# Patient Record
Sex: Male | Born: 1985 | Race: White | Hispanic: No | Marital: Married | Smoking: Former smoker
Health system: Southern US, Community
[De-identification: ages and names within clinical notes are randomized; demographics above are authoritative.]

## PROBLEM LIST (undated history)

## (undated) DIAGNOSIS — L237 Allergic contact dermatitis due to plants, except food: Secondary | ICD-10-CM

## (undated) DIAGNOSIS — N201 Calculus of ureter: Secondary | ICD-10-CM

## (undated) DIAGNOSIS — F411 Generalized anxiety disorder: Secondary | ICD-10-CM

## (undated) DIAGNOSIS — M1990 Unspecified osteoarthritis, unspecified site: Secondary | ICD-10-CM

## (undated) DIAGNOSIS — K219 Gastro-esophageal reflux disease without esophagitis: Secondary | ICD-10-CM

## (undated) DIAGNOSIS — T7840XA Allergy, unspecified, initial encounter: Secondary | ICD-10-CM

## (undated) DIAGNOSIS — Z87442 Personal history of urinary calculi: Secondary | ICD-10-CM

## (undated) DIAGNOSIS — R7989 Other specified abnormal findings of blood chemistry: Secondary | ICD-10-CM

## (undated) HISTORY — PX: ESOPHAGOGASTRODUODENOSCOPY: SHX1529

## (undated) HISTORY — DX: Unspecified osteoarthritis, unspecified site: M19.90

## (undated) HISTORY — DX: Allergy, unspecified, initial encounter: T78.40XA

## (undated) HISTORY — DX: Gastro-esophageal reflux disease without esophagitis: K21.9

## (undated) HISTORY — PX: TYMPANOSTOMY TUBE PLACEMENT: SHX32

---
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
## 2004-07-20 ENCOUNTER — Emergency Department (HOSPITAL_COMMUNITY): Admission: EM | Admit: 2004-07-20 | Discharge: 2004-07-20 | Payer: Self-pay | Admitting: Emergency Medicine

## 2011-03-17 ENCOUNTER — Emergency Department (HOSPITAL_COMMUNITY): Payer: No Typology Code available for payment source

## 2011-03-17 ENCOUNTER — Emergency Department (HOSPITAL_COMMUNITY)
Admission: EM | Admit: 2011-03-17 | Discharge: 2011-03-17 | Disposition: A | Discharge status: Home / Self Care | Payer: No Typology Code available for payment source | Attending: Emergency Medicine | Admitting: Emergency Medicine

## 2011-03-17 DIAGNOSIS — S335XXA Sprain of ligaments of lumbar spine, initial encounter: Secondary | ICD-10-CM | POA: Insufficient documentation

## 2011-03-17 DIAGNOSIS — M62838 Other muscle spasm: Secondary | ICD-10-CM | POA: Insufficient documentation

## 2011-03-17 DIAGNOSIS — Y92410 Unspecified street and highway as the place of occurrence of the external cause: Secondary | ICD-10-CM | POA: Insufficient documentation

## 2011-03-17 DIAGNOSIS — T148XXA Other injury of unspecified body region, initial encounter: Secondary | ICD-10-CM | POA: Insufficient documentation

## 2011-08-17 ENCOUNTER — Ambulatory Visit: Payer: BC Managed Care – HMO | Admitting: Family Medicine

## 2011-08-17 VITALS — BP 133/79 | HR 73 | Temp 99.3°F | Resp 16 | Ht 70.5 in | Wt 216.0 lb

## 2011-08-17 DIAGNOSIS — J039 Acute tonsillitis, unspecified: Secondary | ICD-10-CM

## 2011-08-17 MED ORDER — CLINDAMYCIN HCL 150 MG PO CAPS: 150.0000 mg | ORAL_CAPSULE | Freq: Three times a day (TID) | ORAL | Status: AC

## 2011-08-17 NOTE — Patient Instructions (Signed)
Tonsillitis Tonsils are lumps of lymphoid tissues at the back of the throat. Each tonsil has 20 crevices (crypts). Tonsils help fight nose and throat infections and keep infection from spreading to other parts of the body for the first 18 months of life. Tonsillitis is an infection of the throat that causes the tonsils to become red, tender, and swollen. CAUSES Sudden and, if treated, temporary (acute) tonsillitis is usually caused by infection with streptococcal bacteria. Long lasting (chronic) tonsillitis occurs when the crypts of the tonsils become filled with pieces of food and bacteria, which makes it easy for the tonsils to become constantly infected. SYMPTOMS  Symptoms of tonsillitis include:  A sore throat.   White patches on the tonsils.   Fever.   Tiredness.  DIAGNOSIS Tonsillitis can be diagnosed through a physical exam. Diagnosis can be confirmed with the results of lab tests, including a throat culture. TREATMENT  The goals of tonsillitis treatment include the reduction of the severity and duration of symptoms, prevention of associated conditions, and prevention of disease transmission. Tonsillitis caused by bacteria can be treated with antibiotics. Usually, treatment with antibiotics is started before the cause of the tonsillitis is known. However, if it is determined that the cause is not bacterial, antibiotics will not treat the tonsillitis. If attacks of tonsillitis are severe and frequent, your caregiver may recommend surgery to remove the tonsils (tonsillectomy). HOME CARE INSTRUCTIONS   Rest as much as possible and get plenty of sleep.   Drink plenty of fluids. While the throat is very sore, eat soft foods or liquids, such as sherbet, soups, or instant breakfast drinks.   Eat frozen ice pops.   Older children and adults may gargle with a warm or cold liquid to help soothe the throat. Mix 1 teaspoon of salt in 1 cup of water.   Other family members who also develop a  sore throat or fever should have a medical exam or throat culture.   Only take over-the-counter or prescription medicines for pain, discomfort, or fever as directed by your caregiver.  SEEK MEDICAL CARE IF:   Your baby is older than 3 months with a rectal temperature of 100.5 F (38.1 C) or higher for more than 1 day.   Large, tender lumps develop in your neck.   A rash develops.   Green, yellow-brown, or bloody substance is coughed up.   You are unable to swallow liquids or food for 24 hours.   Your child is unable to swallow food or liquids for 12 hours.  SEEK IMMEDIATE MEDICAL CARE IF:   You develop any new symptoms such as vomiting, severe headache, stiff neck, chest pain, or trouble breathing or swallowing.   You have severe throat pain along with drooling or voice changes.   You have severe pain, unrelieved with recommended medications.   You are unable to fully open the mouth.   You develop redness, swelling, or severe pain anywhere in the neck.   You have a fever.   Your baby is older than 3 months with a rectal temperature of 102 F (38.9 C) or higher.   Your baby is 6 months old or younger with a rectal temperature of 100.4 F (38 C) or higher.  MAKE SURE YOU:   Understand these instructions.   Will watch your condition.   Will get help right away if you are not doing well or get worse.  Document Released: 04/08/2005 Document Revised: 03/11/2011 Document Reviewed: 09/04/2010 [facility] Patient Information 2012 [employer] 

## 2011-08-17 NOTE — Progress Notes (Signed)
26 year old gentleman who works for the city [employer] for the last 3 days. It has been worsening he has a low-grade fever fever. He has no ear pain or shortness of breath.  Patient discontinued dipping tobacco about 2 weeks ago and he has concerns about the possibility of throat cancer.  Objective: Patient has bilateral anterior cervical adenopathy and he has swollen tonsils bilaterally. TMs are normal. Patient is in no acute distress. Respirations are normal.  Assessment: Acute tonsillitis.  Plan: Clindamycin 3 times a day. I instructed patient to stay out of work today. I also let him know that he does not have throat cancer.

## 2011-08-21 ENCOUNTER — Telehealth: Payer: Self-pay

## 2011-08-21 NOTE — Telephone Encounter (Signed)
PT SAW DR L ON Monday, COUGHED SOME BLOOD THIS AM AFTER BLOWING NOSE.  CONCERNED AND WOULD LIKE RETURN CALL

## 2011-08-22 NOTE — Telephone Encounter (Signed)
Please call patient and see if he has had any more episodes of bloody nasal drainage.  If so we are happy to resee him, small amounts of nasal bleeding are common in the winter but if he has had ongoing bleeding or pain he should be reseen.

## 2011-08-22 NOTE — Telephone Encounter (Signed)
Spoke with patient states he has not had anymore episodes.  States he's throat is not much better.  Advised patient to return to clinic if no improvement.  Patient states understanding.

## 2011-08-23 ENCOUNTER — Ambulatory Visit (INDEPENDENT_AMBULATORY_CARE_PROVIDER_SITE_OTHER): Payer: BC Managed Care – HMO | Admitting: Family Medicine

## 2011-08-23 VITALS — BP 128/78 | HR 82 | Temp 98.5°F | Resp 16 | Ht 70.5 in | Wt 275.6 lb

## 2011-08-23 DIAGNOSIS — K219 Gastro-esophageal reflux disease without esophagitis: Secondary | ICD-10-CM

## 2011-08-23 DIAGNOSIS — M542 Cervicalgia: Secondary | ICD-10-CM

## 2011-08-23 MED ORDER — ESOMEPRAZOLE MAGNESIUM 40 MG PO CPDR: 40.0000 mg | DELAYED_RELEASE_CAPSULE | Freq: Every day | ORAL | Status: DC

## 2011-08-23 MED ORDER — PREDNISONE 20 MG PO TABS: 40.0000 mg | ORAL_TABLET | Freq: Every day | ORAL | Status: AC

## 2011-08-23 NOTE — Progress Notes (Signed)
26 yo man with persistent neck pain and fullness l>r.  Some acid reflux symptoms.  Some anxiety attacks.  The clindamycin is not working after 6 days.  Heavy Arboriculturist at [employer]  O:  NAD Oroph:  Normal by palpation, inspection Neck:  Supple with FROM, mildly swollen submandib glands, no thyromegaly.  A:  Possible impacted wisdom tooth, +/- reflux  P:  Finish clindamycin Prednisone 20 mg 2 qd Nexium 40 qd x 7 Dental appt on Tues

## 2011-08-23 NOTE — Progress Notes (Signed)
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 He is married and comes in today with his father to. He has recently been treated for tonsillitis with clindamycin but continues to complain of throat soreness and fullness.  He has a history of reflux symptoms and an impacted wisdom tooth on the left. He has a dental appointment due this coming Tuesday.  He's had no dysphagia, fever, cough or shortness of breath.  Objective: No acute distress HEENT normal with the exception of what appears to be impacted wisdom tooth on the left. Palpation around the head and neck reveals no significant adenopathy although he does have some enlargement of the submandibular node on the left. Chest is clear heart is regular without murmur.  Skin is warm and dry and there is no thyromegaly  Assessment: Possible reflux following treatment for tonsillitis. Impacted wisdom tooth on the left.  Plan: Finish clindamycin, Nexium daily x7, prednisone 20 mg 2 tablets daily for 5 and dental appointment on Tuesday

## 2011-09-02 ENCOUNTER — Ambulatory Visit (INDEPENDENT_AMBULATORY_CARE_PROVIDER_SITE_OTHER): Payer: BC Managed Care – HMO | Admitting: Family Medicine

## 2011-09-02 VITALS — BP 131/86 | HR 82 | Temp 99.0°F | Resp 16 | Ht 71.0 in | Wt 276.0 lb

## 2011-09-02 DIAGNOSIS — F411 Generalized anxiety disorder: Secondary | ICD-10-CM

## 2011-09-02 DIAGNOSIS — J00 Acute nasopharyngitis [common cold]: Secondary | ICD-10-CM

## 2011-09-02 DIAGNOSIS — K219 Gastro-esophageal reflux disease without esophagitis: Secondary | ICD-10-CM

## 2011-09-02 DIAGNOSIS — J029 Acute pharyngitis, unspecified: Secondary | ICD-10-CM

## 2011-09-02 MED ORDER — CITALOPRAM HYDROBROMIDE 10 MG PO TABS: 10.0000 mg | ORAL_TABLET | Freq: Every day | ORAL | Status: DC

## 2011-09-02 MED ORDER — SUCRALFATE 1 G PO TABS: 1.0000 g | ORAL_TABLET | Freq: Two times a day (BID) | ORAL | Status: DC

## 2011-09-02 MED ORDER — ALPRAZOLAM 0.25 MG PO TABS: ORAL_TABLET | ORAL | Status: DC

## 2011-09-02 NOTE — Progress Notes (Signed)
  Subjective:    Patient ID: Peter Owens, male    DOB: 30-Dec-1985, 26 y.o.   MRN: [id number]  HPI  Three week history of throat symptoms Rx with prednisone and clindamycin  Burping asscociated with acid reflux.  Placed on Nexium 40mg  1 week ago No history of URI, allergic rhinitis or pnd  Anxiety attacks x 3 weeks 2-3 times a day. Anxiety runs in the family both parents with anxiety Father on prozac Mother takes xanax Patient had been on xanax and paxil in the past   Review of Systems     Objective:   Physical Exam  Constitutional: He appears well-developed and well-nourished.  HENT:  Head: Normocephalic and atraumatic.  Mouth/Throat: Oropharyngeal exudate: erythema posterior pharnyx.  Neck: Neck supple.  Cardiovascular: Normal rate, regular rhythm and normal heart sounds.   Pulmonary/Chest: Effort normal and breath sounds normal.  Skin: Skin is warm.          Assessment & Plan:   1. GERD (gastroesophageal reflux disease)  sucralfate (CARAFATE) 1 G tablet  2. GAD (generalized anxiety disorder)  citalopram (CELEXA) 10 MG tablet, ALPRAZolam (XANAX) 0.25 MG tablet  3. Pharyngitis     Follow up with Primary MD in 2 weeks

## 2011-09-06 ENCOUNTER — Ambulatory Visit (INDEPENDENT_AMBULATORY_CARE_PROVIDER_SITE_OTHER): Payer: BC Managed Care – HMO | Admitting: Family Medicine

## 2011-09-06 VITALS — BP 108/71 | HR 98 | Temp 98.0°F | Resp 16 | Ht 70.5 in | Wt 273.4 lb

## 2011-09-06 DIAGNOSIS — M542 Cervicalgia: Secondary | ICD-10-CM

## 2011-09-06 DIAGNOSIS — J019 Acute sinusitis, unspecified: Secondary | ICD-10-CM

## 2011-09-06 DIAGNOSIS — F419 Anxiety disorder, unspecified: Secondary | ICD-10-CM

## 2011-09-06 MED ORDER — AZITHROMYCIN 250 MG PO TABS: ORAL_TABLET | ORAL | Status: AC

## 2011-09-06 NOTE — Progress Notes (Signed)
26 yo with 4 weeks of sinus congestion and neck stiffness.  He is worried he may have cancer.  He was seen earlier last week and started on Celexa which he didn't take, and xanax which did little to help.  He has green nasal discharge.  Quit chewing tobacco 5 weeks.  O:  NAD HEENT:  Unremarkable  Neck Neg Chest: reg resp  A:  Sinusitis.  P:  z pak Clonazepam at hs.

## 2011-09-10 ENCOUNTER — Ambulatory Visit (INDEPENDENT_AMBULATORY_CARE_PROVIDER_SITE_OTHER): Payer: BC Managed Care – HMO | Admitting: Emergency Medicine

## 2011-09-10 VITALS — BP 134/88 | HR 84 | Temp 98.8°F | Resp 18 | Ht 71.5 in | Wt 278.0 lb

## 2011-09-10 DIAGNOSIS — F411 Generalized anxiety disorder: Secondary | ICD-10-CM

## 2011-09-10 DIAGNOSIS — G4700 Insomnia, unspecified: Secondary | ICD-10-CM

## 2011-09-10 DIAGNOSIS — J340 Abscess, furuncle and carbuncle of nose: Secondary | ICD-10-CM

## 2011-09-10 DIAGNOSIS — J3489 Other specified disorders of nose and nasal sinuses: Secondary | ICD-10-CM

## 2011-09-10 DIAGNOSIS — F419 Anxiety disorder, unspecified: Secondary | ICD-10-CM

## 2011-09-10 DIAGNOSIS — R040 Epistaxis: Secondary | ICD-10-CM

## 2011-09-10 MED ORDER — MUPIROCIN 2 % EX OINT: TOPICAL_OINTMENT | Freq: Three times a day (TID) | CUTANEOUS | Status: AC

## 2011-09-10 NOTE — Patient Instructions (Signed)
Nosebleed  Nosebleeds can be caused by many conditions including trauma, infections, polyps, foreign bodies, dry mucous membranes or climate, medications and air conditioning. Most nosebleeds occur in the front of the nose. It is because of this location that most nosebleeds can be controlled by pinching the nostrils gently and continuously. Do this for at least 10 to 20 minutes. The reason for this long continuous pressure is that you must hold it long enough for the blood to clot. If during that 10 to 20 minute time period, pressure is released, the process may have to be started again. The nosebleed may stop by itself, quit with pressure, need concentrated heating (cautery) or stop with pressure from packing.  HOME CARE INSTRUCTIONS   · If your nose was packed, try to maintain the pack inside until your caregiver removes it. If a gauze pack was used and it starts to fall out, gently replace or cut the end off. Do not cut if a balloon catheter was used to pack the nose. Otherwise, do not remove unless instructed.   · Avoid blowing your nose for 12 hours after treatment. This could dislodge the pack or clot and start bleeding again.   · If the bleeding starts again, sit up and bending forward, gently pinch the front half of your nose continuously for 20 minutes.   · If bleeding was caused by dry mucous membranes, cover the inside of your nose every morning with a petroleum or antibiotic ointment. Use your little fingertip as an applicator. Do this as needed during dry weather. This will keep the mucous membranes moist and allow them to heal.   · Maintain humidity in your home by using less air conditioning or using a humidifier.   · Do not use aspirin or medications which make bleeding more likely. Your caregiver can give you recommendations on this.   · Resume normal activities as able but try to avoid straining, lifting or bending at the waist for several days.   · If the nosebleeds become recurrent and the cause  is unknown, your caregiver may suggest laboratory tests.   SEEK IMMEDIATE MEDICAL CARE IF:   · Bleeding recurs and cannot be controlled.   · There is unusual bleeding from or bruising on other parts of the body.   · You have a fever.   · Nosebleeds continue.   · There is any worsening of the condition which originally brought you in.   · You become lightheaded, feel faint, become sweaty or vomit blood.   MAKE SURE YOU:   · Understand these instructions.   · Will watch your condition.   · Will get help right away if you are not doing well or get worse.   Document Released: 04/08/2005 Document Revised: 03/11/2011 Document Reviewed: 05/31/2009  [employer]® Patient Information ©2012 [employer].

## 2011-09-10 NOTE — Progress Notes (Signed)
  Subjective:    Patient ID: Peter Owens, male    DOB: May 24, 1986, 26 y.o.   MRN: [id number]  HPI patient enters with a continued sensation of fullness in the back of his throat. He has had recurrent epistaxis from the left side of his nose. He still has some concerns that he may have throat cancer. He uses snuff but stopped recently. His sinusitis symptoms have improved. The pain and drainage from his nose has improved    Review of Systems  Constitutional: Negative.   HENT: Positive for nosebleeds, congestion, rhinorrhea and postnasal drip.   Eyes: Negative.   Respiratory: Negative.   Cardiovascular: Negative.   Gastrointestinal: Negative.   Psychiatric/Behavioral: Positive for sleep disturbance. The patient is nervous/anxious.        Objective:   Physical Exam objective exam reveals an alert male who is in no distress. Examination of the nares on the right is normal. 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        Assessment & Plan:  Assessment is recurrent epistaxis. This appears be secondary to an ulcer on the inside of his left nares. He continues to have anxiety as well as globus symptoms. He is afraid to take Celexa but is comfortable with his Klonopin. I advised him I will make a referral to ENT and check his oropharynx carefully. Have advised him to increase his Klonopin to take a half tablet in the morning one in the  Evening.Marland Kitchen

## 2011-09-22 ENCOUNTER — Ambulatory Visit: Payer: BC Managed Care – HMO

## 2011-09-22 ENCOUNTER — Ambulatory Visit (INDEPENDENT_AMBULATORY_CARE_PROVIDER_SITE_OTHER): Payer: BC Managed Care – HMO | Admitting: Family Medicine

## 2011-09-22 DIAGNOSIS — K219 Gastro-esophageal reflux disease without esophagitis: Secondary | ICD-10-CM

## 2011-09-22 DIAGNOSIS — R5383 Other fatigue: Secondary | ICD-10-CM

## 2011-09-22 DIAGNOSIS — Z0000 Encounter for general adult medical examination without abnormal findings: Secondary | ICD-10-CM

## 2011-09-22 DIAGNOSIS — R079 Chest pain, unspecified: Secondary | ICD-10-CM

## 2011-09-22 LAB — BASIC METABOLIC PANEL
BUN: 10 mg/dL (ref 6–23)
CO2: 27 mEq/L (ref 19–32)
Calcium: 10 mg/dL (ref 8.4–10.5)
Chloride: 103 mEq/L (ref 96–112)
Creat: 0.84 mg/dL (ref 0.50–1.35)
Glucose, Bld: 85 mg/dL (ref 70–99)
Potassium: 5 mEq/L (ref 3.5–5.3)
Sodium: 140 mEq/L (ref 135–145)

## 2011-09-22 LAB — POCT URINALYSIS DIPSTICK
Bilirubin, UA: NEGATIVE
Blood, UA: NEGATIVE
Glucose, UA: NEGATIVE
Ketones, UA: NEGATIVE
Leukocytes, UA: NEGATIVE
Nitrite, UA: NEGATIVE
Protein, UA: NEGATIVE
Spec Grav, UA: 1.02
Urobilinogen, UA: 1
pH, UA: 7.5

## 2011-09-22 LAB — POCT UA - MICROSCOPIC ONLY
Amorphous: POSITIVE
Bacteria, U Microscopic: NEGATIVE
Casts, Ur, LPF, POC: NEGATIVE
Crystals, Ur, HPF, POC: NEGATIVE
Mucus, UA: NEGATIVE
RBC, urine, microscopic: NEGATIVE
WBC, Ur, HPF, POC: NEGATIVE
Yeast, UA: NEGATIVE

## 2011-09-22 LAB — POCT CBC
Granulocyte percent: 57 %G (ref 37–80)
HCT, POC: 47 % (ref 43.5–53.7)
Hemoglobin: 15.9 g/dL (ref 14.1–18.1)
Lymph, poc: 2.4 (ref 0.6–3.4)
MCH, POC: 30.3 pg (ref 27–31.2)
MCHC: 33.8 g/dL (ref 31.8–35.4)
MCV: 89.7 fL (ref 80–97)
MID (cbc): 0.5 (ref 0–0.9)
MPV: 7.6 fL (ref 0–99.8)
POC Granulocyte: 3.9 (ref 2–6.9)
POC LYMPH PERCENT: 35.8 %L (ref 10–50)
POC MID %: 7.2 %M (ref 0–12)
Platelet Count, POC: 429 10*3/uL — AB (ref 142–424)
RBC: 5.24 M/uL (ref 4.69–6.13)
RDW, POC: 13.2 %
WBC: 6.8 10*3/uL (ref 4.6–10.2)

## 2011-09-22 LAB — LIPID PANEL
Cholesterol: 184 mg/dL (ref 0–200)
HDL: 36 mg/dL — ABNORMAL LOW (ref >39)
LDL Cholesterol: 116 mg/dL — ABNORMAL HIGH (ref 0–99)
Total CHOL/HDL Ratio: 5.1 Ratio
Triglycerides: 158 mg/dL — ABNORMAL HIGH (ref <150)
VLDL: 32 mg/dL (ref 0–40)

## 2011-09-22 NOTE — Patient Instructions (Signed)
Health Maintenance, Males A healthy lifestyle and preventative care can promote health and wellness.  Maintain regular health, dental, and eye exams.   Eat a healthy diet. Foods like vegetables, fruits, whole grains, low-fat dairy products, and lean protein foods contain the nutrients you need without too many calories. Decrease your intake of foods high in solid fats, added sugars, and salt. Get information about a proper diet from your caregiver, if necessary.   Regular physical exercise is one of the most important things you can do for your health. Most adults should get at least 150 minutes of moderate-intensity exercise (any activity that increases your heart rate and causes you to sweat) each week. In addition, most adults need muscle-strengthening exercises on 2 or more days a week.    Maintain a healthy weight. The body mass index (BMI) is a screening tool to identify possible weight problems. It provides an estimate of body fat based on height and weight. Your caregiver can help determine your BMI, and can help you achieve or maintain a healthy weight. For adults 20 years and older:   A BMI below 18.5 is considered underweight.   A BMI of 18.5 to 24.9 is normal.   A BMI of 25 to 29.9 is considered overweight.   A BMI of 30 and above is considered obese.   Maintain normal blood lipids and cholesterol by exercising and minimizing your intake of saturated fat. Eat a balanced diet with plenty of fruits and vegetables. Blood tests for lipids and cholesterol should begin at age 20 and be repeated every 5 years. If your lipid or cholesterol levels are high, you are over 50, or you are a high risk for heart disease, you may need your cholesterol levels checked more frequently.Ongoing high lipid and cholesterol levels should be treated with medicines, if diet and exercise are not effective.   If you smoke, find out from your caregiver how to quit. If you do not use tobacco, do not start.    If you choose to drink alcohol, do not exceed 2 drinks per day. One drink is considered to be 12 ounces (355 mL) of beer, 5 ounces (148 mL) of wine, or 1.5 ounces (44 mL) of liquor.   Avoid use of street drugs. Do not share needles with anyone. Ask for help if you need support or instructions about stopping the use of drugs.   High blood pressure causes heart disease and increases the risk of stroke. Blood pressure should be checked at least every 1 to 2 years. Ongoing high blood pressure should be treated with medicines if weight loss and exercise are not effective.   If you are 45 to 27 years old, ask your caregiver if you should take aspirin to prevent heart disease.   Diabetes screening involves taking a blood sample to check your fasting blood sugar level. This should be done once every 3 years, after age 45, if you are within normal weight and without risk factors for diabetes. Testing should be considered at a younger age or be carried out more frequently if you are overweight and have at least 1 risk factor for diabetes.   Colorectal cancer can be detected and often prevented. Most routine colorectal cancer screening begins at the age of 50 and continues through age 75. However, your caregiver may recommend screening at an earlier age if you have risk factors for colon cancer. On a yearly basis, your caregiver may provide home test kits to check for hidden   blood in the stool. Use of a small camera at the end of a tube, to directly examine the colon (sigmoidoscopy or colonoscopy), can detect the earliest forms of colorectal cancer. Talk to your caregiver about this at age 50, when routine screening begins. Direct examination of the colon should be repeated every 5 to 10 years through age 75, unless early forms of pre-cancerous polyps or small growths are found.   Hepatitis C blood testing is recommended for all people born from 1945 through 1965 and any individual with known risks for  hepatitis C.   Healthy men should no longer receive prostate-specific antigen (PSA) blood tests as part of routine cancer screening. Consult with your caregiver about prostate cancer screening.   Testicular cancer screening is not recommended for adolescents or adult males who have no symptoms. Screening includes self-exam, caregiver exam, and other screening tests. Consult with your caregiver about any symptoms you have or any concerns you have about testicular cancer.   Practice safe sex. Use condoms and avoid high-risk sexual practices to reduce the spread of sexually transmitted infections (STIs).   Use sunscreen with a sun protection factor (SPF) of 30 or greater. Apply sunscreen liberally and repeatedly throughout the day. You should seek shade when your shadow is shorter than you. Protect yourself by wearing long sleeves, pants, a wide-brimmed hat, and sunglasses year round, whenever you are outdoors.   Notify your caregiver of new moles or changes in moles, especially if there is a change in shape or color. Also notify your caregiver if a mole is larger than the size of a pencil eraser.   A one-time screening for abdominal aortic aneurysm (AAA) and surgical repair of large AAAs by sound wave imaging (ultrasonography) is recommended for ages 65 to 75 years who are current or former smokers.   Stay current with your immunizations.  Document Released: 12/26/2007 Document Revised: 06/18/2011 Document Reviewed: 11/24/2010 [employer] Patient Information 2012 [employer]. 

## 2011-09-22 NOTE — Progress Notes (Signed)
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 He comes in today for complete physical having felt fatigued for last month. He also has noted intermittent sharp pains in the anterior shoulder regions of both arms. He's had no shortness of breath.  He's recently had an ENT evaluation which included endoscopy. Entire workup was negative. He feels better knowing that there is no cancer or surgical problems. The ENT person told him that he probably has some degree of reflux.  Review of systems: Otherwise normal  Objective: No acute distress, overweight young man alert and cooperative.  HEENT: Unremarkable  Chest: Nontender without bony abnormalities, clear to auscultation  Heart: Regular no murmur  Abdomen: Soft, nontender, no HSM, no masses  Skin: No lesions  Genitalia: Circumcised, no hernia, testes testes descended bilaterally without any abnormality. [facility] reading (PRIMARY) by  Dr. Milus Glazier:  CXR - neg Results for orders placed in visit on 09/22/11  POCT CBC      Component Value Range   WBC 6.8  4.6 - 10.2 (K/uL)   Lymph, poc 2.4  0.6 - 3.4    POC LYMPH PERCENT 35.8  10 - 50 (%L)   MID (cbc) 0.5  0 - 0.9    POC MID % 7.2  0 - 12 (%M)   POC Granulocyte 3.9  2 - 6.9    Granulocyte percent 57.0  37 - 80 (%G)   RBC 5.24  4.69 - 6.13 (M/uL)   Hemoglobin 15.9  14.1 - 18.1 (g/dL)   HCT, POC 14.7  82.9 - 53.7 (%)   MCV 89.7  80 - 97 (fL)   MCH, POC 30.3  27 - 31.2 (pg)   MCHC 33.8  31.8 - 35.4 (g/dL)   RDW, POC 56.2     Platelet Count, POC 429 (*) 142 - 424 (K/uL)   MPV 7.6  0 - 99.8 (fL)  POCT UA - MICROSCOPIC ONLY      Component Value Range   WBC, Ur, HPF, POC neg     RBC, urine, microscopic neg     Bacteria, U Microscopic neg     Mucus, UA neg     Epithelial cells, urine per micros 1-2     Crystals, Ur, HPF, POC neg     Casts, Ur, LPF, POC neg     Yeast, UA neg     Amorphous pos    POCT URINALYSIS DIPSTICK      Component Value Range     Color, UA yellow     Clarity, UA cloudy     Glucose, UA neg     Bilirubin, UA neg     Ketones, UA neg     Spec Grav, UA 1.020     Blood, UA neg     pH, UA 7.5     Protein, UA neg     Urobilinogen, UA 1.0     Nitrite, UA neg     Leukocytes, UA Negative      A:  Overweight young man with anxiety, mild reflux  P:  Work on weight loss.  Continue current meds.  Follow-up 3 months Usual annual labs

## 2011-09-23 LAB — TSH: TSH: 0.704 u[IU]/mL (ref 0.350–4.500)

## 2011-09-30 ENCOUNTER — Ambulatory Visit (INDEPENDENT_AMBULATORY_CARE_PROVIDER_SITE_OTHER): Payer: BC Managed Care – PPO | Admitting: Family Medicine

## 2011-09-30 ENCOUNTER — Ambulatory Visit
Admission: RE | Admit: 2011-09-30 | Discharge: 2011-09-30 | Disposition: A | Discharge status: Home / Self Care | Payer: BC Managed Care – PPO | Source: Ambulatory Visit | Attending: Family Medicine | Admitting: Family Medicine

## 2011-09-30 ENCOUNTER — Telehealth: Payer: Self-pay

## 2011-09-30 VITALS — BP 131/78 | HR 74 | Temp 98.2°F | Resp 16 | Ht 72.0 in | Wt 249.0 lb

## 2011-09-30 DIAGNOSIS — R1013 Epigastric pain: Secondary | ICD-10-CM

## 2011-09-30 LAB — POCT UA - MICROSCOPIC ONLY
Bacteria, U Microscopic: NEGATIVE
Casts, Ur, LPF, POC: NEGATIVE
Crystals, Ur, HPF, POC: NEGATIVE
Epithelial cells, urine per micros: NEGATIVE
Mucus, UA: NEGATIVE
RBC, urine, microscopic: NEGATIVE
WBC, Ur, HPF, POC: NEGATIVE
Yeast, UA: NEGATIVE

## 2011-09-30 LAB — POCT URINALYSIS DIPSTICK
Bilirubin, UA: NEGATIVE
Blood, UA: NEGATIVE
Glucose, UA: NEGATIVE
Ketones, UA: NEGATIVE
Leukocytes, UA: NEGATIVE
Nitrite, UA: NEGATIVE
Protein, UA: NEGATIVE
Spec Grav, UA: 1.015
Urobilinogen, UA: 0.2
pH, UA: 8.5

## 2011-09-30 LAB — POCT CBC
Granulocyte percent: 61.5 %G (ref 37–80)
HCT, POC: 45.3 % (ref 43.5–53.7)
Hemoglobin: 15.9 g/dL (ref 14.1–18.1)
Lymph, poc: 2.3 (ref 0.6–3.4)
MCH, POC: 31.5 pg — AB (ref 27–31.2)
MCHC: 35.1 g/dL (ref 31.8–35.4)
MCV: 89.8 fL (ref 80–97)
MID (cbc): 0.5 (ref 0–0.9)
MPV: 8.3 fL (ref 0–99.8)
POC Granulocyte: 4.5 (ref 2–6.9)
POC LYMPH PERCENT: 31.6 %L (ref 10–50)
POC MID %: 6.9 %M (ref 0–12)
Platelet Count, POC: 367 10*3/uL (ref 142–424)
RBC: 5.04 M/uL (ref 4.69–6.13)
RDW, POC: 13.5 %
WBC: 7.3 10*3/uL (ref 4.6–10.2)

## 2011-09-30 MED ORDER — IOHEXOL 300 MG/ML  SOLN
125.0000 mL | Freq: Once | INTRAMUSCULAR | Status: AC | PRN
  Administered 2011-09-30: 125 mL via INTRAVENOUS

## 2011-09-30 MED ORDER — DICYCLOMINE HCL 20 MG PO TABS: 20.0000 mg | ORAL_TABLET | Freq: Three times a day (TID) | ORAL | Status: AC

## 2011-09-30 NOTE — Progress Notes (Signed)
26 yo man with epigastric pain overnight.  Onset was gradual about 8 pm last night.  He slept okay but awoke this morning with more pain that is persisting.  He had a BM which was well-formed today, and voided clear urine normally.  Pain is steady and worsens when moving.  He has some associated nausea.  Hasn't eaten today.  O:  NAD Abdomen: some guarding, tender periumbilical area, no rebound, no HSM, no localized pain with palpation Chest: cta Heart: regular, no murmur  Results for orders placed in visit on 09/30/11  POCT CBC      Component Value Range   WBC 7.3  4.6 - 10.2 (K/uL)   Lymph, poc 2.3  0.6 - 3.4    POC LYMPH PERCENT 31.6  10 - 50 (%L)   MID (cbc) 0.5  0 - 0.9    POC MID % 6.9  0 - 12 (%M)   POC Granulocyte 4.5  2 - 6.9    Granulocyte percent 61.5  37 - 80 (%G)   RBC 5.04  4.69 - 6.13 (M/uL)   Hemoglobin 15.9  14.1 - 18.1 (g/dL)   HCT, POC 16.1  09.6 - 53.7 (%)   MCV 89.8  80 - 97 (fL)   MCH, POC 31.5 (*) 27 - 31.2 (pg)   MCHC 35.1  31.8 - 35.4 (g/dL)   RDW, POC 04.5     Platelet Count, POC 367  142 - 424 (K/uL)   MPV 8.3  0 - 99.8 (fL)  POCT URINALYSIS DIPSTICK      Component Value Range   Color, UA yellow     Clarity, UA clear     Glucose, UA neg     Bilirubin, UA neg     Ketones, UA neg     Spec Grav, UA 1.015     Blood, UA neg     pH, UA 8.5     Protein, UA neg     Urobilinogen, UA 0.2     Nitrite, UA neg     Leukocytes, UA Negative    POCT UA - MICROSCOPIC ONLY      Component Value Range   WBC, Ur, HPF, POC neg     RBC, urine, microscopic neg     Bacteria, U Microscopic neg     Mucus, UA neg     Epithelial cells, urine per micros neg     Crystals, Ur, HPF, POC neg     Casts, Ur, LPF, POC neg     Yeast, UA neg      A:  Suspicious for early appedicitis.  He has pain when just moving and the pain is worsening.  P:  Abdominal/pelvic ct this am with contrast.--> CT negative Rx:  bentyl

## 2011-09-30 NOTE — Telephone Encounter (Signed)
[facility] called with normal CT abd/pel. Spoke with pt to inform. Called in Rx Bentyl to [facility] in [place] per Dr Milus Glazier

## 2011-10-08 ENCOUNTER — Ambulatory Visit: Payer: BC Managed Care – PPO | Admitting: Family Medicine

## 2011-10-08 DIAGNOSIS — J3089 Other allergic rhinitis: Secondary | ICD-10-CM

## 2011-10-08 DIAGNOSIS — J019 Acute sinusitis, unspecified: Secondary | ICD-10-CM

## 2011-10-08 DIAGNOSIS — J329 Chronic sinusitis, unspecified: Secondary | ICD-10-CM

## 2011-10-08 DIAGNOSIS — R1084 Generalized abdominal pain: Secondary | ICD-10-CM

## 2011-10-08 LAB — POCT CBC
Granulocyte percent: 48.9 %G (ref 37–80)
MID (cbc): 0.6 (ref 0–0.9)
POC Granulocyte: 3.5 (ref 2–6.9)
POC LYMPH PERCENT: 42.8 %L (ref 10–50)
Platelet Count, POC: 319 10*3/uL (ref 142–424)
RDW, POC: 12.8 %

## 2011-10-08 LAB — POCT URINALYSIS DIPSTICK
Leukocytes, UA: NEGATIVE
Nitrite, UA: NEGATIVE
Protein, UA: NEGATIVE

## 2011-10-08 LAB — POCT UA - MICROSCOPIC ONLY
Crystals, Ur, HPF, POC: NEGATIVE
Yeast, UA: NEGATIVE

## 2011-10-08 MED ORDER — FLUTICASONE PROPIONATE 50 MCG/ACT NA SUSP: 2.0000 | Freq: Every day | NASAL | Status: DC

## 2011-10-08 MED ORDER — AZITHROMYCIN 250 MG PO TABS: ORAL_TABLET | ORAL | Status: AC

## 2011-10-08 NOTE — Progress Notes (Signed)
Subjective:    Patient ID: Peter Owens, male    DOB: Dec 03, 1985, 26 y.o.   MRN: [id number]  HPI  Patient presents with facial congestion, pnd and cough productive of purulent sputum. Known seasonal allergies; works outdoors.  Recent CT scan of the abdomen/pelvis secondary to RLQ abdominal pain. Scan was negative for diverticulitis and appendicitis. Still with diffuse symptoms. Denies fever, night sweats or unintentional weight loss. Multiple small BM's daily normal  Has not yet picked up Bentyl that was prescribed for him.  Currently taking Nexium for GERD and without symptoms   Review of Systems     Objective:   Physical Exam  Constitutional: He appears well-developed.  HENT:  Head: Normocephalic.  Nose: Mucosal edema and rhinorrhea present.  Mouth/Throat: Posterior oropharyngeal erythema present.  Neck: Neck supple.  Cardiovascular: Normal rate, regular rhythm and normal heart sounds.   Pulmonary/Chest: Effort normal and breath sounds normal.  Abdominal: There is tenderness in the right lower quadrant, suprapubic area and left lower quadrant. There is no rebound and no guarding.  Neurological: He is alert.  Skin: Skin is warm.  Psychiatric: He has a normal mood and affect.   Results for orders placed in visit on 10/08/11  POCT CBC      Component Value Range   WBC 7.2  4.6 - 10.2 (K/uL)   Lymph, poc 3.1  0.6 - 3.4    POC LYMPH PERCENT 42.8  10 - 50 (%L)   MID (cbc) 0.6  0 - 0.9    POC MID % 7.3  0 - 12 (%M)   POC Granulocyte 3.5  2 - 6.9    Granulocyte percent 48.9  37 - 80 (%G)   RBC 4.32 (*) 4.69 - 6.13 (M/uL)   Hemoglobin 12.9 (*) 14.1 - 18.1 (g/dL)   HCT, POC 91.4 (*) 78.2 - 53.7 (%)   MCV 88.6  80 - 97 (fL)   MCH, POC 29.9  27 - 31.2 (pg)   MCHC 33.7  31.8 - 35.4 (g/dL)   RDW, POC 95.6     Platelet Count, POC 319  142 - 424 (K/uL)   MPV 7.6  0 - 99.8 (fL)  POCT UA - MICROSCOPIC ONLY      Component Value Range   WBC, Ur, HPF, POC 2-0.3     RBC,  urine, microscopic 0-2     Bacteria, U Microscopic trace     Mucus, UA neg     Epithelial cells, urine per micros 0-2     Crystals, Ur, HPF, POC neg     Casts, Ur, LPF, POC neg     Yeast, UA neg    POCT URINALYSIS DIPSTICK      Component Value Range   Color, UA yellow     Clarity, UA clear     Glucose, UA neg     Bilirubin, UA neg     Ketones, UA tr     Spec Grav, UA 1.025     Blood, UA neg     pH, UA 6.0     Protein, UA neg     Urobilinogen, UA 0.2     Nitrite, UA neg     Leukocytes, UA Negative             Assessment & Plan:   1. Allergic rhinitis    2. Sinusitis    3. Abdominal pain  POCT CBC, POCT UA - Microscopic Only, POCT urinalysis dipstick, Sedimentation Rate   Encouraged  patient to pick up Bentyl that was previously prescribed for him and if after 10 days  abdominal symptoms persist we will refer to GI.  Patient in agreement with plan.

## 2011-10-09 LAB — SEDIMENTATION RATE: Sed Rate: 4 mm/hr (ref 0–16)

## 2011-10-15 ENCOUNTER — Ambulatory Visit: Payer: BC Managed Care – PPO

## 2011-10-15 ENCOUNTER — Ambulatory Visit: Payer: BC Managed Care – PPO | Admitting: Family Medicine

## 2011-10-15 DIAGNOSIS — F411 Generalized anxiety disorder: Secondary | ICD-10-CM

## 2011-10-15 DIAGNOSIS — R109 Unspecified abdominal pain: Secondary | ICD-10-CM

## 2011-10-15 DIAGNOSIS — F419 Anxiety disorder, unspecified: Secondary | ICD-10-CM

## 2011-10-15 LAB — COMPREHENSIVE METABOLIC PANEL
ALT: 19 U/L (ref 0–53)
AST: 19 U/L (ref 0–37)
Alkaline Phosphatase: 58 U/L (ref 39–117)
Calcium: 9.3 mg/dL (ref 8.4–10.5)
Chloride: 106 mEq/L (ref 96–112)
Creat: 0.91 mg/dL (ref 0.50–1.35)
Total Bilirubin: 0.4 mg/dL (ref 0.3–1.2)
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
## 2011-10-18 ENCOUNTER — Other Ambulatory Visit: Payer: Self-pay | Admitting: Family Medicine

## 2011-10-18 ENCOUNTER — Telehealth: Payer: Self-pay | Admitting: Family Medicine

## 2011-10-18 MED ORDER — GABAPENTIN 300 MG PO CAPS: 300.0000 mg | ORAL_CAPSULE | Freq: Every day | ORAL | Status: DC

## 2011-10-18 NOTE — Telephone Encounter (Signed)
Spoke with patient regarding labs, he states that he doesn't understand why he is still having these sharp chest pains.  They are on the right side of his chest, and feel worse when he is bending over.  He states no one here can figure out what it is, but he wants Dr. Cain Saupe opinion on what to do from here.  To MD.

## 2011-10-18 NOTE — Telephone Encounter (Signed)
I believe these are coming from an irritated rib and underlying nerve.  Let me have him start neurontin.  I'll call it in to be taken at night to help the nerve pain

## 2011-10-19 NOTE — Telephone Encounter (Signed)
Patient notified and will try rx.

## 2011-10-27 ENCOUNTER — Other Ambulatory Visit: Payer: Self-pay | Admitting: Gastroenterology

## 2011-10-27 DIAGNOSIS — R109 Unspecified abdominal pain: Secondary | ICD-10-CM

## 2011-11-05 ENCOUNTER — Other Ambulatory Visit (HOSPITAL_COMMUNITY): Payer: BC Managed Care – PPO

## 2011-11-09 ENCOUNTER — Encounter (HOSPITAL_COMMUNITY)
Admission: RE | Admit: 2011-11-09 | Discharge: 2011-11-09 | Disposition: A | Discharge status: Home / Self Care | Payer: BC Managed Care – PPO | Source: Ambulatory Visit | Attending: Gastroenterology | Admitting: Gastroenterology

## 2011-11-09 ENCOUNTER — Ambulatory Visit (HOSPITAL_COMMUNITY)
Admission: RE | Admit: 2011-11-09 | Discharge: 2011-11-09 | Disposition: A | Discharge status: Home / Self Care | Payer: BC Managed Care – PPO | Source: Ambulatory Visit | Attending: Gastroenterology | Admitting: Gastroenterology

## 2011-11-09 DIAGNOSIS — R109 Unspecified abdominal pain: Secondary | ICD-10-CM | POA: Insufficient documentation

## 2011-11-09 MED ORDER — TECHNETIUM TC 99M MEBROFENIN IV KIT
5.0000 | PACK | Freq: Once | INTRAVENOUS | Status: AC | PRN
  Administered 2011-11-09: 5 via INTRAVENOUS

## 2011-11-09 MED ORDER — SINCALIDE 5 MCG IJ SOLR
0.0200 ug/kg | Freq: Once | INTRAMUSCULAR | Status: AC
  Administered 2011-11-09: 2.5 ug via INTRAVENOUS

## 2011-11-09 MED ORDER — SINCALIDE 5 MCG IJ SOLR
INTRAMUSCULAR | Status: AC
  Filled 2011-11-09: qty 10

## 2012-02-08 ENCOUNTER — Encounter (INDEPENDENT_AMBULATORY_CARE_PROVIDER_SITE_OTHER): Payer: Self-pay | Admitting: Surgery

## 2012-03-01 ENCOUNTER — Ambulatory Visit (INDEPENDENT_AMBULATORY_CARE_PROVIDER_SITE_OTHER): Payer: BC Managed Care – PPO | Admitting: Surgery

## 2012-03-16 ENCOUNTER — Encounter (INDEPENDENT_AMBULATORY_CARE_PROVIDER_SITE_OTHER): Payer: Self-pay | Admitting: Surgery

## 2012-08-21 IMAGING — NM NM HEPATO W/GB/PHARM/[PERSON_NAME]
3 series · 13 of 13 positions shown · non-contrast
Comparison: Ultrasound 11/09/2011

CLINICAL DATA: Abdominal pain.

NUCLEAR MEDICINE HEPATOBILIARY IMAGING WITH GALLBLADDER EF
TECHNIQUE: Sequential images of the abdomen were obtained [DATE]
minutes following intravenous administration of
radiopharmaceutical.  After slow intravenous infusion of 0.4ucg
Cholecystokinin, gallbladder ejection fraction was determined.
Radiopharmaceutical:  9.QmGi 0c-DDm Choletec

[he hepatobiliary · 3.43mm/px · 6 of 45 frames shown (1 of 3)]
[frame 4/45]
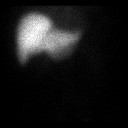
[frame 12/45]
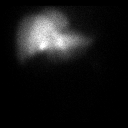
[frame 19/45]
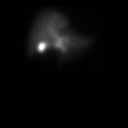
[frame 27/45]
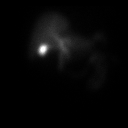
[frame 34/45]
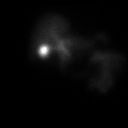
[frame 42/45]
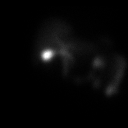

[he hepatobiliary · 3.43mm/px · 6 of 30 frames shown (2 of 3)]
[frame 3/30]
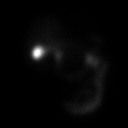
[frame 8/30]
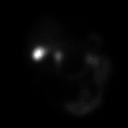
[frame 13/30]
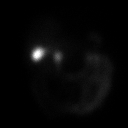
[frame 18/30]
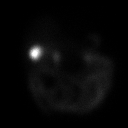
[frame 23/30]
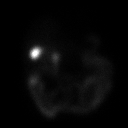
[frame 28/30]
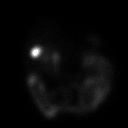

[he hepatobiliary · 1 of 1 slices shown (3 of 3)]
[im 1/1]
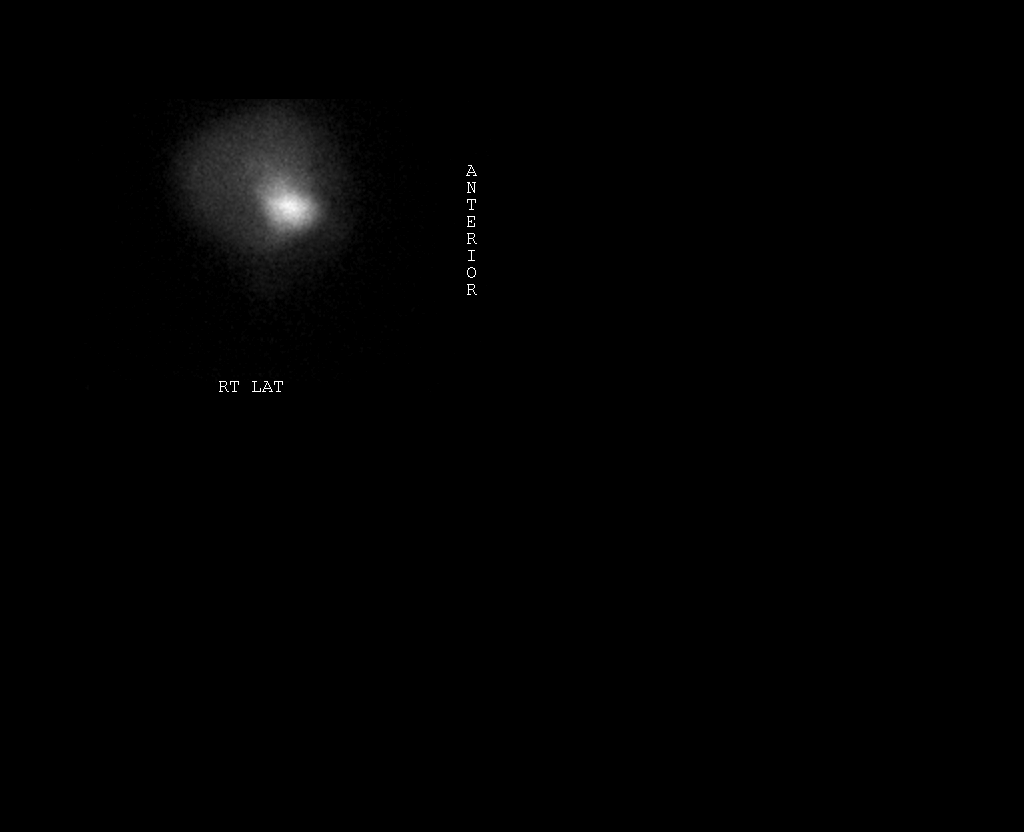

[13 of 13 positions shown; findings below may reference images not displayed]

FINDINGS: There is prompt uptake and excretion of radiotracer by
the liver.  No evidence of cystic duct or common duct obstruction.
Gallbladder ejection fraction is 44%.  Normal is greater than 30%.

The patient did experience symptoms during CCK infusion.
IMPRESSION: Normal gallbladder ejection fraction.  Mild abdominal pain with CCK
administration.

## 2012-12-05 ENCOUNTER — Ambulatory Visit (INDEPENDENT_AMBULATORY_CARE_PROVIDER_SITE_OTHER): Payer: BC Managed Care – PPO | Admitting: Family Medicine

## 2012-12-05 VITALS — BP 140/94 | HR 80 | Temp 98.0°F | Resp 16 | Ht 71.0 in | Wt 285.0 lb

## 2012-12-05 DIAGNOSIS — Z0000 Encounter for general adult medical examination without abnormal findings: Secondary | ICD-10-CM

## 2012-12-05 DIAGNOSIS — Z23 Encounter for immunization: Secondary | ICD-10-CM

## 2012-12-05 LAB — COMPREHENSIVE METABOLIC PANEL
ALT: 28 U/L (ref 0–53)
AST: 22 U/L (ref 0–37)
Albumin: 4.5 g/dL (ref 3.5–5.2)
Alkaline Phosphatase: 59 U/L (ref 39–117)
BUN: 10 mg/dL (ref 6–23)
CO2: 30 mEq/L (ref 19–32)
Calcium: 9.3 mg/dL (ref 8.4–10.5)
Chloride: 105 mEq/L (ref 96–112)
Creat: 0.91 mg/dL (ref 0.50–1.35)
Glucose, Bld: 90 mg/dL (ref 70–99)
Potassium: 4.6 mEq/L (ref 3.5–5.3)
Sodium: 140 mEq/L (ref 135–145)
Total Bilirubin: 0.5 mg/dL (ref 0.3–1.2)
Total Protein: 7 g/dL (ref 6.0–8.3)

## 2012-12-05 LAB — POCT URINALYSIS DIPSTICK
Bilirubin, UA: NEGATIVE
Blood, UA: NEGATIVE
Glucose, UA: NEGATIVE
Ketones, UA: NEGATIVE
Leukocytes, UA: NEGATIVE
Nitrite, UA: NEGATIVE
Protein, UA: NEGATIVE
Spec Grav, UA: 1.025
Urobilinogen, UA: 0.2
pH, UA: 6.5

## 2012-12-05 LAB — LIPID PANEL
Cholesterol: 196 mg/dL (ref 0–200)
HDL: 37 mg/dL — ABNORMAL LOW (ref >39)
LDL Cholesterol: 125 mg/dL — ABNORMAL HIGH (ref 0–99)
Total CHOL/HDL Ratio: 5.3 Ratio
Triglycerides: 170 mg/dL — ABNORMAL HIGH (ref <150)
VLDL: 34 mg/dL (ref 0–40)

## 2012-12-05 LAB — POCT CBC
Granulocyte percent: 59.7 %G (ref 37–80)
HCT, POC: 45.9 % (ref 43.5–53.7)
Hemoglobin: 15.2 g/dL (ref 14.1–18.1)
Lymph, poc: 2.4 (ref 0.6–3.4)
MCH, POC: 30.8 pg (ref 27–31.2)
MCHC: 33.1 g/dL (ref 31.8–35.4)
MCV: 92.9 fL (ref 80–97)
MID (cbc): 0.6 (ref 0–0.9)
MPV: 8 fL (ref 0–99.8)
POC Granulocyte: 4.4 (ref 2–6.9)
POC LYMPH PERCENT: 31.8 %L (ref 10–50)
POC MID %: 8.5 %M (ref 0–12)
Platelet Count, POC: 350 10*3/uL (ref 142–424)
RBC: 4.94 M/uL (ref 4.69–6.13)
RDW, POC: 12.9 %
WBC: 7.4 10*3/uL (ref 4.6–10.2)

## 2012-12-05 MED ORDER — TETANUS-DIPHTH-ACELL PERTUSSIS 5-2.5-18.5 LF-MCG/0.5 IM SUSP
0.5000 mL | Freq: Once | INTRAMUSCULAR | Status: AC
  Administered 2012-12-05: 0.5 mL via INTRAMUSCULAR

## 2012-12-05 NOTE — Progress Notes (Signed)
  Subjective:    Patient ID: Peter Owens, male    DOB: Nov 20, 1985, 27 y.o.   MRN: [id number]  HPI    Review of Systems     Objective:   Physical Exam        Assessment & Plan:

## 2012-12-05 NOTE — Patient Instructions (Signed)
Health Maintenance, Males A healthy lifestyle and preventative care can promote health and wellness.  Maintain regular health, dental, and eye exams.  Eat a healthy diet. Foods like vegetables, fruits, whole grains, low-fat dairy products, and lean protein foods contain the nutrients you need without too many calories. Decrease your intake of foods high in solid fats, added sugars, and salt. Get information about a proper diet from your caregiver, if necessary.  Regular physical exercise is one of the most important things you can do for your health. Most adults should get at least 150 minutes of moderate-intensity exercise (any activity that increases your heart rate and causes you to sweat) each week. In addition, most adults need muscle-strengthening exercises on 2 or more days a week.   Maintain a healthy weight. The body mass index (BMI) is a screening tool to identify possible weight problems. It provides an estimate of body fat based on height and weight. Your caregiver can help determine your BMI, and can help you achieve or maintain a healthy weight. For adults 20 years and older:  A BMI below 18.5 is considered underweight.  A BMI of 18.5 to 24.9 is normal.  A BMI of 25 to 29.9 is considered overweight.  A BMI of 30 and above is considered obese.  Maintain normal blood lipids and cholesterol by exercising and minimizing your intake of saturated fat. Eat a balanced diet with plenty of fruits and vegetables. Blood tests for lipids and cholesterol should begin at age 20 and be repeated every 5 years. If your lipid or cholesterol levels are high, you are over 50, or you are a high risk for heart disease, you may need your cholesterol levels checked more frequently.Ongoing high lipid and cholesterol levels should be treated with medicines, if diet and exercise are not effective.  If you smoke, find out from your caregiver how to quit. If you do not use tobacco, do not start.  If you  choose to drink alcohol, do not exceed 2 drinks per day. One drink is considered to be 12 ounces (355 mL) of beer, 5 ounces (148 mL) of wine, or 1.5 ounces (44 mL) of liquor.  Avoid use of street drugs. Do not share needles with anyone. Ask for help if you need support or instructions about stopping the use of drugs.  High blood pressure causes heart disease and increases the risk of stroke. Blood pressure should be checked at least every 1 to 2 years. Ongoing high blood pressure should be treated with medicines if weight loss and exercise are not effective.  If you are 45 to 28 years old, ask your caregiver if you should take aspirin to prevent heart disease.  Diabetes screening involves taking a blood sample to check your fasting blood sugar level. This should be done once every 3 years, after age 45, if you are within normal weight and without risk factors for diabetes. Testing should be considered at a younger age or be carried out more frequently if you are overweight and have at least 1 risk factor for diabetes.  Colorectal cancer can be detected and often prevented. Most routine colorectal cancer screening begins at the age of 50 and continues through age 75. However, your caregiver may recommend screening at an earlier age if you have risk factors for colon cancer. On a yearly basis, your caregiver may provide home test kits to check for hidden blood in the stool. Use of a small camera at the end of a tube,   to directly examine the colon (sigmoidoscopy or colonoscopy), can detect the earliest forms of colorectal cancer. Talk to your caregiver about this at age 50, when routine screening begins. Direct examination of the colon should be repeated every 5 to 10 years through age 75, unless early forms of pre-cancerous polyps or small growths are found.  Hepatitis C blood testing is recommended for all people born from 1945 through 1965 and any individual with known risks for hepatitis C.  Healthy  men should no longer receive prostate-specific antigen (PSA) blood tests as part of routine cancer screening. Consult with your caregiver about prostate cancer screening.  Testicular cancer screening is not recommended for adolescents or adult males who have no symptoms. Screening includes self-exam, caregiver exam, and other screening tests. Consult with your caregiver about any symptoms you have or any concerns you have about testicular cancer.  Practice safe sex. Use condoms and avoid high-risk sexual practices to reduce the spread of sexually transmitted infections (STIs).  Use sunscreen with a sun protection factor (SPF) of 30 or greater. Apply sunscreen liberally and repeatedly throughout the day. You should seek shade when your shadow is shorter than you. Protect yourself by wearing long sleeves, pants, a wide-brimmed hat, and sunglasses year round, whenever you are outdoors.  Notify your caregiver of new moles or changes in moles, especially if there is a change in shape or color. Also notify your caregiver if a mole is larger than the size of a pencil eraser.  A one-time screening for abdominal aortic aneurysm (AAA) and surgical repair of large AAAs by sound wave imaging (ultrasonography) is recommended for ages 65 to 75 years who are current or former smokers.  Stay current with your immunizations. Document Released: 12/26/2007 Document Revised: 09/21/2011 Document Reviewed: 11/24/2010 [employer] Patient Information 2014 [employer].  

## 2012-12-05 NOTE — Progress Notes (Signed)
Patient ID: Peter Owens MRN: [id number], DOB: 1985-10-10 27 y.o. Date of Encounter: 12/05/2012, 10:41 AM  Primary Physician: Elvina Sidle, MD  Chief Complaint: Physical (CPE)  HPI: 27 y.o. y/o male with history noted below here for CPE.  Doing well. No issues/complaints.  Review of Systems: Consitutional: No fever, chills, fatigue, night sweats, lymphadenopathy, or weight changes. Eyes: No visual changes, eye redness, or discharge. ENT/Mouth: Ears: No otalgia, tinnitus, hearing loss, discharge. Nose: No congestion, rhinorrhea, sinus pain, or epistaxis. Throat: No sore throat, post nasal drip, or teeth pain. Cardiovascular: No CP, palpitations, diaphoresis, DOE, edema, orthopnea, PND. Respiratory: No cough, hemoptysis, SOB, or wheezing. Gastrointestinal: No anorexia, dysphagia, reflux, pain, nausea, vomiting, hematemesis, diarrhea, constipation, BRBPR, or melena. Genitourinary: No dysuria, frequency, urgency, hematuria, incontinence, nocturia, decreased urinary stream, discharge, impotence, or testicular pain/masses. Musculoskeletal: No decreased ROM, myalgias, stiffness, joint swelling, or weakness. Skin: No rash, erythema, lesion changes, pain, warmth, jaundice, or pruritis. Neurological: No headache, dizziness, syncope, seizures, tremors, memory loss, coordination problems, or paresthesias. Psychological: No anxiety, depression, hallucinations, SI/HI. Endocrine: No fatigue, polydipsia, polyphagia, polyuria, or known diabetes. All other systems were reviewed and are otherwise negative.  Past Medical History  Diagnosis Date  . Allergy   . GERD (gastroesophageal reflux disease)   . Arthritis      History reviewed. No pertinent past surgical history.  Home Meds:  Prior to Admission medications   Medication Sig Start Date End Date Taking? Authorizing Provider  Multiple Vitamins-Minerals (ONE-A-DAY MENS HEALTH FORMULA) TABS Take by mouth.   Yes Historical Provider, MD    cetirizine (ZYRTEC) 1 MG/ML syrup Take by mouth daily.    Historical Provider, MD  clonazePAM (KLONOPIN) 0.5 MG tablet Take 0.5 mg by mouth 2 (two) times daily as needed.    Historical Provider, MD  esomeprazole (NEXIUM) 40 MG capsule Take 1 capsule (40 mg total) by mouth daily. 08/23/11 08/22/12  Elvina Sidle, MD  fluticasone (FLONASE) 50 MCG/ACT nasal spray Place 2 sprays into the nose daily. 10/08/11 10/07/12  Dois Davenport, MD  gabapentin (NEURONTIN) 300 MG capsule Take 1 capsule (300 mg total) by mouth at bedtime. 10/18/11 10/17/12  Elvina Sidle, MD  niacin 500 MG tablet Take 500 mg by mouth daily with breakfast.    Historical Provider, MD  sucralfate (CARAFATE) 1 G tablet Take 1 g by mouth 2 (two) times daily. 09/02/11 09/01/12  Dois Davenport, MD    Allergies:  Allergies  Allergen Reactions  . Amoxicillin   . Penicillins Other (See Comments)    childhood    History   Social History  . Marital Status: Married    Spouse Name: N/A    Number of Children: N/A  . Years of Education: N/A   Occupational History  . Not on file.   Social History Main Topics  . Smoking status: Former Games developer  . Smokeless tobacco: Former Neurosurgeon    Types: Snuff    Quit date: 08/15/2011  . Alcohol Use: No  . Drug Use: No  . Sexually Active: Not on file   Other Topics Concern  . Not on file   Social History Narrative  . No narrative on file    Family History  Problem Relation Age of Onset  . Diabetes Mother   . Hypertension Mother   . Heart disease Mother   . Arthritis Mother   . Hypertension Father     Physical Exam: Blood pressure 140/94, pulse 80, temperature 98 F (36.7 C), temperature source Oral,  resp. rate 16, height 5\' 11"  (1.803 m), weight 285 lb (129.275 kg), SpO2 99.00%.  General: Well developed, well nourished, in no acute distress. HEENT: Normocephalic, atraumatic. Conjunctiva pink, sclera non-icteric. Pupils 2 mm constricting to 1 mm, round, regular, and equally reactive  to light and accomodation. EOMI. Internal auditory canal clear. TMs with good cone of light and without pathology. Nasal mucosa pink. Nares are without discharge. No sinus tenderness. Oral mucosa pink. Dentition good. Pharynx without exudate.   Neck: Supple. Trachea midline. No thyromegaly. Full ROM. No lymphadenopathy. Lungs: Clear to auscultation bilaterally without wheezes, rales, or rhonchi. Breathing is of normal effort and unlabored. Cardiovascular: RRR with S1 S2. No murmurs, rubs, or gallops appreciated. Distal pulses 2+ symmetrically. No carotid or abdominal bruits Abdomen: Soft, non-tender, non-distended with normoactive bowel sounds. No hepatosplenomegaly or masses. No rebound/guarding. No CVA tenderness. Without hernias.   Genitourinary:  uncircumcised male. No penile lesions. Testes descended bilaterally, and smooth without tenderness or masses.  Musculoskeletal: Full range of motion and 5/5 strength throughout. Without swelling, atrophy, tenderness, crepitus, or warmth. Extremities without clubbing, cyanosis, or edema. Calves supple. Skin: Warm and moist without erythema, ecchymosis, wounds, or rash. Neuro: A+Ox3. CN II-XII grossly intact. Moves all extremities spontaneously. Full sensation throughout. Normal gait. DTR 2+ throughout upper and lower extremities. Finger to nose intact. Psych:  Responds to questions appropriately with a normal affect.   Studies: CBC, CMET, Lipid, hep B surface antibody UA:  Results for orders placed in visit on 12/05/12  POCT CBC      Result Value Range   WBC 7.4  4.6 - 10.2 K/uL   Lymph, poc 2.4  0.6 - 3.4   POC LYMPH PERCENT 31.8  10 - 50 %L   MID (cbc) 0.6  0 - 0.9   POC MID % 8.5  0 - 12 %M   POC Granulocyte 4.4  2 - 6.9   Granulocyte percent 59.7  37 - 80 %G   RBC 4.94  4.69 - 6.13 M/uL   Hemoglobin 15.2  14.1 - 18.1 g/dL   HCT, POC 16.1  09.6 - 53.7 %   MCV 92.9  80 - 97 fL   MCH, POC 30.8  27 - 31.2 pg   MCHC 33.1  31.8 - 35.4 g/dL    RDW, POC 04.5     Platelet Count, POC 350  142 - 424 K/uL   MPV 8.0  0 - 99.8 fL  POCT URINALYSIS DIPSTICK      Result Value Range   Color, UA yellow     Clarity, UA clear     Glucose, UA neg     Bilirubin, UA neg     Ketones, UA neg     Spec Grav, UA 1.025     Blood, UA neg     pH, UA 6.5     Protein, UA neg     Urobilinogen, UA 0.2     Nitrite, UA neg     Leukocytes, UA Negative       Assessment/Plan:  27 y.o. y/o  male here for CPE -Annual physical exam - Plan: POCT CBC, Comprehensive metabolic panel, Lipid panel, POCT urinalysis dipstick, Hepatitis B surface antibody, TDaP (BOOSTRIX) injection 0.5 mL    Signed, Elvina Sidle, MD 12/05/2012 10:41 AM

## 2012-12-06 LAB — HEPATITIS B SURFACE ANTIBODY, QUANTITATIVE: Hepatitis B-Post: 65.8 m[IU]/mL

## 2013-01-12 ENCOUNTER — Ambulatory Visit (INDEPENDENT_AMBULATORY_CARE_PROVIDER_SITE_OTHER): Payer: BC Managed Care – PPO | Admitting: Family Medicine

## 2013-01-12 ENCOUNTER — Emergency Department (HOSPITAL_COMMUNITY)
Admission: EM | Admit: 2013-01-12 | Discharge: 2013-01-13 | Disposition: A | Discharge status: Home / Self Care | Payer: BC Managed Care – PPO | Attending: Emergency Medicine | Admitting: Emergency Medicine

## 2013-01-12 ENCOUNTER — Ambulatory Visit: Payer: BC Managed Care – PPO

## 2013-01-12 ENCOUNTER — Encounter (HOSPITAL_COMMUNITY): Payer: Self-pay | Admitting: Emergency Medicine

## 2013-01-12 ENCOUNTER — Emergency Department (HOSPITAL_COMMUNITY): Payer: BC Managed Care – PPO

## 2013-01-12 VITALS — BP 140/92 | HR 78 | Temp 98.2°F | Resp 18 | Ht 71.5 in | Wt 286.0 lb

## 2013-01-12 DIAGNOSIS — R0602 Shortness of breath: Secondary | ICD-10-CM | POA: Insufficient documentation

## 2013-01-12 DIAGNOSIS — R079 Chest pain, unspecified: Secondary | ICD-10-CM

## 2013-01-12 DIAGNOSIS — R072 Precordial pain: Secondary | ICD-10-CM | POA: Insufficient documentation

## 2013-01-12 DIAGNOSIS — Z79899 Other long term (current) drug therapy: Secondary | ICD-10-CM | POA: Insufficient documentation

## 2013-01-12 DIAGNOSIS — Z8739 Personal history of other diseases of the musculoskeletal system and connective tissue: Secondary | ICD-10-CM | POA: Insufficient documentation

## 2013-01-12 DIAGNOSIS — R002 Palpitations: Secondary | ICD-10-CM | POA: Insufficient documentation

## 2013-01-12 DIAGNOSIS — R109 Unspecified abdominal pain: Secondary | ICD-10-CM

## 2013-01-12 DIAGNOSIS — K219 Gastro-esophageal reflux disease without esophagitis: Secondary | ICD-10-CM | POA: Insufficient documentation

## 2013-01-12 DIAGNOSIS — R51 Headache: Secondary | ICD-10-CM | POA: Insufficient documentation

## 2013-01-12 DIAGNOSIS — Z87891 Personal history of nicotine dependence: Secondary | ICD-10-CM | POA: Insufficient documentation

## 2013-01-12 DIAGNOSIS — Z8659 Personal history of other mental and behavioral disorders: Secondary | ICD-10-CM | POA: Insufficient documentation

## 2013-01-12 LAB — CBC
MCH: 30.3 pg (ref 26.0–34.0)
MCHC: 35.4 g/dL (ref 30.0–36.0)
Platelets: 350 10*3/uL (ref 150–400)
RBC: 4.91 MIL/uL (ref 4.22–5.81)
RDW: 12.2 % (ref 11.5–15.5)

## 2013-01-12 LAB — GLUCOSE, POCT (MANUAL RESULT ENTRY): POC Glucose: 91 mg/dl (ref 70–99)

## 2013-01-12 LAB — BASIC METABOLIC PANEL
Calcium: 9.5 mg/dL (ref 8.4–10.5)
Creatinine, Ser: 0.92 mg/dL (ref 0.50–1.35)
GFR calc Af Amer: >90 mL/min (ref >90)
GFR calc non Af Amer: >90 mL/min (ref >90)
Sodium: 138 mEq/L (ref 135–145)

## 2013-01-12 LAB — POCT I-STAT TROPONIN I: Troponin i, poc: 0 ng/mL (ref 0.00–0.08)

## 2013-01-12 LAB — POCT CBC
HCT, POC: 41.9 % — AB (ref 43.5–53.7)
Hemoglobin: 13.8 g/dL — AB (ref 14.1–18.1)
Lymph, poc: 2.4 (ref 0.6–3.4)
MPV: 8.2 fL (ref 0–99.8)
POC Granulocyte: 6.2 (ref 2–6.9)
POC MID %: 6.1 %M (ref 0–12)
RBC: 4.53 M/uL — AB (ref 4.69–6.13)
WBC: 9.1 10*3/uL (ref 4.6–10.2)

## 2013-01-12 MED ORDER — ASPIRIN 325 MG PO TABS
325.0000 mg | ORAL_TABLET | Freq: Once | ORAL | Status: AC
  Administered 2013-01-13: 325 mg via ORAL
  Filled 2013-01-12: qty 1

## 2013-01-12 NOTE — ED Notes (Signed)
Patient with chest pain that started three days ago.  He was sent from [facility] to be evaluated.  Patient does have history of anxiety and has had chest pain associated with his anxiety before.  Chest pain is intermittent, non-radiating and patient describes it as sharp and burning.  During episodes of chest pain he becomes SOB, but denies dizziness and nausea, vomiting.

## 2013-01-12 NOTE — Progress Notes (Signed)
[facility] [address] [place] [address] [phone]  Date:  01/12/2013   Name:  Peter Owens   DOB:  Dec 08, 1985   MRN:  [id number]  PCP:  Elvina Sidle, MD    Chief Complaint: Abdominal Pain, Chest Pain, Shaking and Shortness of Breath   History of Present Illness:  Peter Owens is a 27 y.o. very pleasant male patient who presents with the following:  He has noted a feeling that "my stomach and intestines have swelled" for the last week.  He has pain in the upper abdomen on both sides, notes chest pains and that he is short of breath.  "I feel like my chest is caving in, I can't breathe and I'm going to die."  The CP and SOB have been present for about 3 days.  It bothers him most when he leaves work in the evening.  Today when he left work ot drive home he had a severe attack of these sx and became quite worried. During the day (while working) he feels ok.  He does not currently have the CP, and the SOB is also better.    No nausea or vomiting He is eating ok- did not eat dinner yet today but did eat lunch He has noted chills, but no fever.  He notes a mild cough and a feeling of phlegm in his throat.    He feels that he is "bloated and has indigestion."  He notes that this is similar to when he was here last year and was diagnosed with anxiety.  He is no longer taking any medication for anxiety, but has taken celexa and klonopin in the past.  "that stuff, it did not work."    He does not have any cardiac issues, or any significant past medical history per his knowledge.  He has never had a cardiac work- up or treadmill test.   His mother does have CHF, and his dad had a. Fib.  His mother is doing ok, but is a diabetic as well.    He quit chewing tobacco about 18 months ago, non - smoker.   He does not use drugs or drink alcohol.   Patient Active Problem List   Diagnosis Date Noted  . GERD (gastroesophageal reflux disease) 09/22/2011  .  Anxiety 09/10/2011    Past Medical History  Diagnosis Date  . Allergy   . GERD (gastroesophageal reflux disease)   . Arthritis     History reviewed. No pertinent past surgical history.  History  Substance Use Topics  . Smoking status: Former Games developer  . Smokeless tobacco: Former Neurosurgeon    Types: Snuff    Quit date: 08/15/2011  . Alcohol Use: No    Family History  Problem Relation Age of Onset  . Diabetes Mother   . Hypertension Mother   . Heart disease Mother   . Arthritis Mother   . Hypertension Father     Allergies  Allergen Reactions  . Amoxicillin   . Penicillins Other (See Comments)    childhood    Medication list has been reviewed and updated.  Current Outpatient Prescriptions on File Prior to Visit  Medication Sig Dispense Refill  . fluticasone (FLONASE) 50 MCG/ACT nasal spray Place 2 sprays into the nose daily.  1 g  6   No current facility-administered medications on file prior to visit.    Review of Systems:  As per HPI- otherwise negative.   Physical Examination: Filed Vitals:  01/12/13 2026  BP: 140/92  Pulse: 78  Temp: 98.2 F (36.8 C)  Resp: 18   Filed Vitals:   01/12/13 2026  Height: 5' 11.5" (1.816 m)  Weight: 286 lb (129.729 kg)   Body mass index is 39.34 kg/(m^2). Ideal Body Weight: Weight in (lb) to have BMI = 25: 181.4  GEN: WDWN, NAD, Non-toxic, A & O x 3, obese, slightly anxious.   HEENT: Atraumatic, Normocephalic. Neck supple. No masses, No LAD.  Bilateral TM wnl, oropharynx normal.  PEERL,EOMI.   Ears and Nose: No external deformity. CV: RRR, No M/G/R. No JVD. No thrill. No extra heart sounds. PULM: CTA B, no wheezes, crackles, rhonchi. No retractions. No resp. distress. No accessory muscle use. ABD: S, NT, ND, +BS. No rebound. No HSM. Benign exam EXTR: No c/c/e NEURO Normal gait.  PSYCH: Normally interactive. Conversant and appropriate  [facility] reading (PRIMARY) by  Dr. Patsy Lager. Chest x-ray: normal except poor  inspiration Abdominal x-ray: increased right colon stool burden. 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 which is new compared to old EKG   Results for orders placed in visit on 01/12/13  POCT CBC      Result Value Range   WBC 9.1  4.6 - 10.2 K/uL   Lymph, poc 2.4  0.6 - 3.4   POC LYMPH PERCENT 25.9  10 - 50 %L   MID (cbc) 0.6  0 - 0.9   POC MID % 6.1  0 - 12 %M   POC Granulocyte 6.2  2 - 6.9   Granulocyte percent 68.0  37 - 80 %G   RBC 4.53 (*) 4.69 - 6.13 M/uL   Hemoglobin 13.8 (*) 14.1 - 18.1 g/dL   HCT, POC 95.6 (*) 21.3 - 53.7 %   MCV 92.5  80 - 97 fL   MCH, POC 30.5  27 - 31.2 pg   MCHC 32.9  31.8 - 35.4 g/dL   RDW, POC 08.6     Platelet Count, POC 337  142 - 424 K/uL   MPV 8.2  0 - 99.8 fL  GLUCOSE, POCT (MANUAL RESULT ENTRY)      Result Value Range   POC Glucose 91  70 - 99 mg/dl    Assessment and Plan: Chest pain - Plan: EKG 12-Lead  Shortness of breath - Plan: DG Chest 2 View  Abdominal  pain, other specified site - Plan: POCT CBC, POCT glucose (manual entry), Comprehensive metabolic panel, Amylase, Lipase, DG Abd 2 Views  Peter Owens is here today with 3 days of intermittent abdominal pain, chest pain, SOB and a feeling of doom.  He had an especially bad episode on the way home from work today which prompted him to come in.   His evaluation here so far is generally benign.  He does have a poor inspiration on his CXR, which may be due to obesity.  His EKG shows some subtle changes from past EKG.    Explained to Peter Owens that although I do not see definite evidence of any acute dangerous pathology, if he is having CP/ SOB and a feeling that he is going to die he should have further evaluation which I am not able to do at clinic.  He may need cardiac enzymes and a d dimer.  He will proceed to the ED with a family member driving. Offered to call EMS but he feels comfortable traveling by private vehicle. His sister  will pick him up for transport.  At this time his sx are much better, and he does not have CP or SOB at time of discharge.    Signed Abbe Amsterdam, MD

## 2013-01-12 NOTE — ED Notes (Signed)
ION:GEXB2<WU> Expected date:<BR> Expected time:<BR> Means of arrival:<BR> Comments:<BR> Hold for EKG

## 2013-01-12 NOTE — Patient Instructions (Addendum)
Please proceed to the emergency department for further evaluation.  I hope that you are feeling better soon!

## 2013-01-12 NOTE — ED Provider Notes (Signed)
History    CSN: [id number] Arrival date & time 01/12/13  2222  First MD Initiated Contact with Patient 01/12/13 2307     Chief Complaint  Patient presents with  . Chest Pain    Patient is a 27 y.o. male presenting with chest pain. The history is provided by the patient and a relative (sister present).  Chest Pain Pain location:  Substernal area Pain quality: pressure   Pain radiates to the back: no   Pain severity:  Moderate Timing:  Intermittent Progression:  Resolved Chronicity:  Recurrent Relieved by:  Rest Worsened by:  Nothing tried Associated symptoms: abdominal pain, headache, palpitations and shortness of breath   Associated symptoms: no cough, no dizziness, no fever, no syncope, not vomiting and no weakness    Pt presents from urgent care for chest pain He reports for past 3 days he has had episodes of chest pain, anxiety, shortness of breath, "heart pounding" and feeling "jittery" tonights episode lasted about 3 hours and resolved at 2230 He is now symptom free He has had this before but never this bad No h/o CAD No pleuritic pain No drug use He does not smoke  Past Medical History  Diagnosis Date  . Allergy   . GERD (gastroesophageal reflux disease)   . Arthritis   . Anxiety    History reviewed. No pertinent past surgical history. Family History  Problem Relation Age of Onset  . Diabetes Mother   . Hypertension Mother   . Heart disease Mother   . Arthritis Mother   . Thyroid disease Mother   . Hypertension Father   . Thyroid disease Father    History  Substance Use Topics  . Smoking status: Former Games developer  . Smokeless tobacco: Former Neurosurgeon    Types: Snuff    Quit date: 08/15/2011  . Alcohol Use: No    Review of Systems  Constitutional: Negative for fever.  Respiratory: Positive for shortness of breath. Negative for cough.   Cardiovascular: Positive for chest pain and palpitations. Negative for syncope.  Gastrointestinal: Positive for  abdominal pain. Negative for vomiting.  Neurological: Positive for headaches. Negative for dizziness and weakness.  Psychiatric/Behavioral: The patient is nervous/anxious.   All other systems reviewed and are negative.    Allergies  Amoxicillin; Cephalosporins; and Penicillins  Home Medications   Current Outpatient Rx  Name  Route  Sig  Dispense  Refill  . dexlansoprazole (DEXILANT) 60 MG capsule   Oral   Take 60 mg by mouth daily.          BP 147/83  Pulse 76  Temp(Src) 98.7 F (37.1 C) (Oral)  Resp 20  Wt 286 lb (129.729 kg)  BMI 39.34 kg/m2  SpO2 100% Physical Exam CONSTITUTIONAL: Well developed/well nourished HEAD: Normocephalic/atraumatic EYES: EOMI/PERRL ENMT: Mucous membranes moist NECK: supple no meningeal signs SPINE:entire spine nontender CV: S1/S2 noted, no murmurs/rubs/gallops noted LUNGS: Lungs are clear to auscultation bilaterally, no apparent distress ABDOMEN: soft, nontender, no rebound or guarding GU:no cva tenderness NEURO: Pt is awake/alert, moves all extremitiesx4 EXTREMITIES: pulses normal, full ROM, no calf tenderness noted SKIN: warm, color normal PSYCH: no abnormalities of mood noted  ED Course  Procedures Labs Reviewed  BASIC METABOLIC PANEL - Abnormal; Notable for the following:    Glucose, Bld 108 (*)    All other components within normal limits  CBC  TSH  POCT I-STAT TROPONIN I   Dg Chest 2 View  01/12/2013   *RADIOLOGY REPORT*  Clinical Data: Severe  chest and upper abdominal pain.  CHEST - 2 VIEW  Comparison: PA and lateral chest 09/22/2011.  Findings: Lungs are clear.  Heart size is normal.  No pneumothorax or pleural fluid.  IMPRESSION: Negative chest.  Clinically significant discrepancy from primary report, if provided: None   Original Report Authenticated By: Holley Dexter, M.D.   Dg Abd 2 Views  01/12/2013   *RADIOLOGY REPORT*  Clinical Data: Severe abdominal pain.  ABDOMEN - 2 VIEW  Comparison: Chest two views abdomen  10/15/2011.  Findings: The bowel gas pattern is unremarkable.  No abnormal abdominal calcification or focal bony abnormality.  IMPRESSION: Negative exam.  Clinically significant discrepancy from primary report, if provided: None   Original Report Authenticated By: Holley Dexter, M.D.    11:40 PM Pt is otherwise healthy here with CP.  He is now CP free Will recheck troponin at 0130 as that is 3 hrs after last episode He requests TSH to be sent for thyroid evaluation will need f/u as outpatient I doubt PE given history.  No signs of aortic dissection He did have some abdominal pain earlier but none at this time 2:31 AM Pt sleeping, no further pain episodes Repeat trop negative Stable for d/c  MDM  Nursing notes including past medical history and social history reviewed and considered in documentation xrays reviewed and considered Previous records reviewed and considered - urgent care notes reviewed Labs/vital reviewed and considered     Date: 01/12/2013 2052  Rate: 67  Rhythm: normal sinus rhythm  QRS Axis: right  Intervals: normal  ST/T Wave abnormalities: nonspecific ST changes  Conduction Disutrbances:nonspecific intraventricular conduction delay     Date: 01/12/2013 22:28  Rate: 73  Rhythm: normal sinus rhythm  QRS Axis: right  Intervals: normal  ST/T Wave abnormalities: nonspecific ST changes  Conduction Disutrbances:nonspecific intraventricular conduction delay  Narrative Interpretation:   Old EKG Reviewed: unchanged from 2052 earlier today   Date: 01/12/2013 23:22  Rate: 65  Rhythm: normal sinus rhythm  QRS Axis: right  Intervals: normal  ST/T Wave abnormalities: nonspecific ST changes  Conduction Disutrbances:nonspecific intraventricular conduction delay  Narrative Interpretation:   Old EKG Reviewed: unchanged from earlier        Joya Gaskins, MD 01/13/13 [phone]

## 2013-01-12 NOTE — ED Notes (Signed)
EKG given to EDP, Wickline,MD.

## 2013-01-13 LAB — COMPREHENSIVE METABOLIC PANEL
ALT: 22 U/L (ref 0–53)
AST: 21 U/L (ref 0–37)
Albumin: 4.8 g/dL (ref 3.5–5.2)
CO2: 27 mEq/L (ref 19–32)
Calcium: 9.6 mg/dL (ref 8.4–10.5)
Chloride: 105 mEq/L (ref 96–112)
Potassium: 4.5 mEq/L (ref 3.5–5.3)
Sodium: 140 mEq/L (ref 135–145)
Total Protein: 7 g/dL (ref 6.0–8.3)

## 2013-01-13 LAB — POCT I-STAT TROPONIN I

## 2013-01-13 LAB — TSH: TSH: 1.418 u[IU]/mL (ref 0.350–4.500)

## 2013-01-13 LAB — AMYLASE: Amylase: 26 U/L (ref 0–105)

## 2013-01-14 ENCOUNTER — Encounter: Payer: Self-pay | Admitting: Family Medicine

## 2013-01-15 ENCOUNTER — Ambulatory Visit (INDEPENDENT_AMBULATORY_CARE_PROVIDER_SITE_OTHER): Payer: BC Managed Care – PPO | Admitting: Family Medicine

## 2013-01-15 ENCOUNTER — Encounter: Payer: Self-pay | Admitting: Family Medicine

## 2013-01-15 VITALS — BP 114/70 | HR 80 | Temp 97.9°F | Resp 16 | Ht 71.0 in | Wt 284.6 lb

## 2013-01-15 DIAGNOSIS — R079 Chest pain, unspecified: Secondary | ICD-10-CM

## 2013-01-15 DIAGNOSIS — F410 Panic disorder [episodic paroxysmal anxiety] without agoraphobia: Secondary | ICD-10-CM

## 2013-01-15 MED ORDER — ALPRAZOLAM 0.25 MG PO TABS: ORAL_TABLET | ORAL | Status: DC

## 2013-01-15 NOTE — Patient Instructions (Addendum)
Results for orders placed during the hospital encounter of 01/12/13  CBC      Result Value Range   WBC 9.4  4.0 - 10.5 K/uL   RBC 4.91  4.22 - 5.81 MIL/uL   Hemoglobin 14.9  13.0 - 17.0 g/dL   HCT 13.0  86.5 - 78.4 %   MCV 85.7  78.0 - 100.0 fL   MCH 30.3  26.0 - 34.0 pg   MCHC 35.4  30.0 - 36.0 g/dL   RDW 69.6  29.5 - 28.4 %   Platelets 350  150 - 400 K/uL  BASIC METABOLIC PANEL      Result Value Range   Sodium 138  135 - 145 mEq/L   Potassium 4.0  3.5 - 5.1 mEq/L   Chloride 102  96 - 112 mEq/L   CO2 28  19 - 32 mEq/L   Glucose, Bld 108 (*) 70 - 99 mg/dL   BUN 12  6 - 23 mg/dL   Creatinine, Ser 1.32  0.50 - 1.35 mg/dL   Calcium 9.5  8.4 - 44.0 mg/dL   GFR calc non Af Amer >90  >90 mL/min   GFR calc Af Amer >90  >90 mL/min  TSH      Result Value Range   TSH 1.418  0.350 - 4.500 uIU/mL  POCT I-STAT TROPONIN I      Result Value Range   Troponin i, poc 0.00  0.00 - 0.08 ng/mL   Comment 3           POCT I-STAT TROPONIN I      Result Value Range   Troponin i, poc 0.00  0.00 - 0.08 ng/mL   Comment 3            Anxiety and Panic Attacks Your caregiver has informed you that you are having an anxiety or panic attack. There may be many forms of this. Most of the time these attacks come suddenly and without warning. They come at any time of day, including periods of sleep, and at any time of life. They may be strong and unexplained. Although panic attacks are very scary, they are physically harmless. Sometimes the cause of your anxiety is not known. Anxiety is a protective mechanism of the body in its fight or flight mechanism. Most of these perceived danger situations are actually nonphysical situations (such as anxiety over losing a job). CAUSES  The causes of an anxiety or panic attack are many. Panic attacks may occur in otherwise healthy people given a certain set of circumstances. There may be a genetic cause for panic attacks. Some medications may also have anxiety as a side  effect. SYMPTOMS  Some of the most common feelings are:  Intense terror.  Dizziness, feeling faint.  Hot and cold flashes.  Fear of going crazy.  Feelings that nothing is real.  Sweating.  Shaking.  Chest pain or a fast heartbeat (palpitations).  Smothering, choking sensations.  Feelings of impending doom and that death is near.  Tingling of extremities, this may be from over-breathing.  Altered reality (derealization).  Being detached from yourself (depersonalization). Several symptoms can be present to make up anxiety or panic attacks. DIAGNOSIS  The evaluation by your caregiver will depend on the type of symptoms you are experiencing. The diagnosis of anxiety or panic attack is made when no physical illness can be determined to be a cause of the symptoms. TREATMENT  Treatment to prevent anxiety and panic attacks may include:  Avoidance  of circumstances that cause anxiety.  Reassurance and relaxation.  Regular exercise.  Relaxation therapies, such as yoga.  Psychotherapy with a psychiatrist or therapist.  Avoidance of caffeine, alcohol and illegal drugs.  Prescribed medication. SEEK IMMEDIATE MEDICAL CARE IF:   You experience panic attack symptoms that are different than your usual symptoms.  You have any worsening or concerning symptoms. Document Released: 06/29/2005 Document Revised: 09/21/2011 Document Reviewed: 10/31/2009 [facility] Patient Information 2014 [place], [place].

## 2013-01-15 NOTE — Progress Notes (Signed)
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  These episodes last an hour or so, then subside.  He was seen in the ED three nights ago with normal lab and evaluation.  Objective:  NAD HEENT:  Normal Chest:  Clear Heart:  Reg, no murmur Abdomen: soft, nontender, no HSM, no masses.   Results for orders placed during the hospital encounter of 01/12/13  CBC      Result Value Range   WBC 9.4  4.0 - 10.5 K/uL   RBC 4.91  4.22 - 5.81 MIL/uL   Hemoglobin 14.9  13.0 - 17.0 g/dL   HCT 16.1  09.6 - 04.5 %   MCV 85.7  78.0 - 100.0 fL   MCH 30.3  26.0 - 34.0 pg   MCHC 35.4  30.0 - 36.0 g/dL   RDW 40.9  81.1 - 91.4 %   Platelets 350  150 - 400 K/uL  BASIC METABOLIC PANEL      Result Value Range   Sodium 138  135 - 145 mEq/L   Potassium 4.0  3.5 - 5.1 mEq/L   Chloride 102  96 - 112 mEq/L   CO2 28  19 - 32 mEq/L   Glucose, Bld 108 (*) 70 - 99 mg/dL   BUN 12  6 - 23 mg/dL   Creatinine, Ser 7.82  0.50 - 1.35 mg/dL   Calcium 9.5  8.4 - 95.6 mg/dL   GFR calc non Af Amer >90  >90 mL/min   GFR calc Af Amer >90  >90 mL/min  TSH      Result Value Range   TSH 1.418  0.350 - 4.500 uIU/mL  POCT I-STAT TROPONIN I      Result Value Range   Troponin i, poc 0.00  0.00 - 0.08 ng/mL   Comment 3           POCT I-STAT TROPONIN I      Result Value Range   Troponin i, poc 0.00  0.00 - 0.08 ng/mL   Comment 3            Assessment:  Most c/w a panic disorder

## 2013-01-20 ENCOUNTER — Telehealth: Payer: Self-pay

## 2013-01-20 NOTE — Telephone Encounter (Signed)
Patient reassured about his blood work, advised him these are normal Amy

## 2013-01-20 NOTE — Telephone Encounter (Signed)
Pt has a question about [facility] CBC because it came back abnormal he was sent a letter from Dr Patsy Lager and he was wondering why it could have came back abnormal Call back number is  [phone]

## 2013-01-28 ENCOUNTER — Ambulatory Visit (INDEPENDENT_AMBULATORY_CARE_PROVIDER_SITE_OTHER): Payer: BC Managed Care – PPO | Admitting: Family Medicine

## 2013-01-28 VITALS — BP 116/80 | HR 84 | Temp 98.1°F | Resp 16 | Ht 71.0 in | Wt 283.0 lb

## 2013-01-28 DIAGNOSIS — R1011 Right upper quadrant pain: Secondary | ICD-10-CM

## 2013-01-28 DIAGNOSIS — G8929 Other chronic pain: Secondary | ICD-10-CM

## 2013-01-28 DIAGNOSIS — F410 Panic disorder [episodic paroxysmal anxiety] without agoraphobia: Secondary | ICD-10-CM

## 2013-01-28 MED ORDER — ALPRAZOLAM 0.5 MG PO TBDP: 0.5000 mg | ORAL_TABLET | Freq: Two times a day (BID) | ORAL | Status: DC | PRN

## 2013-01-28 MED ORDER — SERTRALINE HCL 50 MG PO TABS: 50.0000 mg | ORAL_TABLET | Freq: Every day | ORAL | Status: DC

## 2013-01-28 MED ORDER — ALPRAZOLAM 0.5 MG PO TABS: 0.5000 mg | ORAL_TABLET | Freq: Two times a day (BID) | ORAL | Status: DC | PRN

## 2013-01-28 NOTE — Progress Notes (Signed)
Still having some panic attacks with the alprazolam, usually twice daily around noon and dinner time.  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  Still having RUQ pains, worse after greasy food, lasts for 15-20 minutes.  Positive F/H gallbladder problems.  Last year the HIDA scan showed some delay in gallbladder emptying.  Objective:  NAD  *RADIOLOGY REPORT*  Clinical Data: Abdominal pain.  NUCLEAR MEDICINE HEPATOBILIARY IMAGING WITH GALLBLADDER EF  Technique: Sequential images of the abdomen were obtained out to 60  minutes following intravenous administration of  radiopharmaceutical. After slow intravenous infusion of 2.5ucg  Cholecystokinin, gallbladder ejection fraction was determined.  Radiopharmaceutical: 5.42mCi Tc-88m Choletec  Comparison: Ultrasound 11/09/2011  Findings: There is prompt uptake and excretion of radiotracer by  the liver. No evidence of cystic duct or common duct obstruction.  Gallbladder ejection fraction is 44%. Normal is greater than 30%.  The patient did experience symptoms during CCK infusion.  IMPRESSION:  Normal gallbladder ejection fraction. Mild abdominal pain with CCK  administration.  Original Report Authenticated By: Cyndie Chime, M.D.     RUQ nontender, no HSM no masses.   Assessment:  Panic disorder not well controlled.  RUQ sx without solid evidence of cholecystitis.  Panic disorder  Signed, Elvina Sidle, MD

## 2013-03-02 ENCOUNTER — Emergency Department (HOSPITAL_BASED_OUTPATIENT_CLINIC_OR_DEPARTMENT_OTHER)
Admission: EM | Admit: 2013-03-02 | Discharge: 2013-03-02 | Disposition: A | Discharge status: Home / Self Care | Payer: BC Managed Care – PPO | Attending: Emergency Medicine | Admitting: Emergency Medicine

## 2013-03-02 ENCOUNTER — Emergency Department (HOSPITAL_BASED_OUTPATIENT_CLINIC_OR_DEPARTMENT_OTHER): Payer: BC Managed Care – PPO

## 2013-03-02 ENCOUNTER — Encounter (HOSPITAL_BASED_OUTPATIENT_CLINIC_OR_DEPARTMENT_OTHER): Payer: Self-pay | Admitting: *Deleted

## 2013-03-02 DIAGNOSIS — Z8739 Personal history of other diseases of the musculoskeletal system and connective tissue: Secondary | ICD-10-CM | POA: Insufficient documentation

## 2013-03-02 DIAGNOSIS — Z79899 Other long term (current) drug therapy: Secondary | ICD-10-CM | POA: Insufficient documentation

## 2013-03-02 DIAGNOSIS — Z87891 Personal history of nicotine dependence: Secondary | ICD-10-CM | POA: Insufficient documentation

## 2013-03-02 DIAGNOSIS — F411 Generalized anxiety disorder: Secondary | ICD-10-CM | POA: Insufficient documentation

## 2013-03-02 DIAGNOSIS — K219 Gastro-esophageal reflux disease without esophagitis: Secondary | ICD-10-CM | POA: Insufficient documentation

## 2013-03-02 DIAGNOSIS — R110 Nausea: Secondary | ICD-10-CM | POA: Insufficient documentation

## 2013-03-02 DIAGNOSIS — N23 Unspecified renal colic: Secondary | ICD-10-CM | POA: Insufficient documentation

## 2013-03-02 DIAGNOSIS — Z880 Allergy status to penicillin: Secondary | ICD-10-CM | POA: Insufficient documentation

## 2013-03-02 DIAGNOSIS — R319 Hematuria, unspecified: Secondary | ICD-10-CM | POA: Insufficient documentation

## 2013-03-02 LAB — URINE MICROSCOPIC-ADD ON

## 2013-03-02 LAB — URINALYSIS, ROUTINE W REFLEX MICROSCOPIC
Glucose, UA: NEGATIVE mg/dL
Ketones, ur: 15 mg/dL — AB
Specific Gravity, Urine: 1.028 (ref 1.005–1.030)
pH: 6 (ref 5.0–8.0)

## 2013-03-02 MED ORDER — ONDANSETRON 8 MG PO TBDP: 8.0000 mg | ORAL_TABLET | Freq: Three times a day (TID) | ORAL | Status: DC | PRN

## 2013-03-02 MED ORDER — OXYCODONE-ACETAMINOPHEN 5-325 MG PO TABS: 2.0000 | ORAL_TABLET | ORAL | Status: DC | PRN

## 2013-03-02 MED ORDER — KETOROLAC TROMETHAMINE 30 MG/ML IJ SOLN
30.0000 mg | Freq: Once | INTRAMUSCULAR | Status: AC
  Administered 2013-03-02: 30 mg via INTRAVENOUS
  Filled 2013-03-02: qty 1

## 2013-03-02 MED ORDER — CIPROFLOXACIN HCL 500 MG PO TABS: 500.0000 mg | ORAL_TABLET | Freq: Two times a day (BID) | ORAL | Status: DC

## 2013-03-02 MED ORDER — CEPHALEXIN 500 MG PO CAPS: 500.0000 mg | ORAL_CAPSULE | Freq: Four times a day (QID) | ORAL | Status: DC

## 2013-03-02 MED ORDER — HYDROMORPHONE HCL PF 1 MG/ML IJ SOLN
1.0000 mg | Freq: Once | INTRAMUSCULAR | Status: DC
  Filled 2013-03-02: qty 1

## 2013-03-02 NOTE — ED Provider Notes (Signed)
CSN: [id number]     Arrival date & time 03/02/13  1515 History     First MD Initiated Contact with Patient 03/02/13 1535     Chief Complaint  Patient presents with  . Flank Pain  . Hematuria   (Consider location/radiation/quality/duration/timing/severity/associated sxs/prior Treatment) HPI Patient with right flank pain began today about 11 a.m.   Patient with radiation from right flank to rlq.  Nausea no vomiting.  Pain waxing and waning. NO increasing or decreasing factors.  Patient noted hematuria this afternoon.  He has a history of kidney stone beginning about age 27.  No fever or chills.  Pain is currently 4/10.   Past Medical History  Diagnosis Date  . Allergy   . GERD (gastroesophageal reflux disease)   . Arthritis   . Anxiety    History reviewed. No pertinent past surgical history. Family History  Problem Relation Age of Onset  . Diabetes Mother   . Hypertension Mother   . Heart disease Mother   . Arthritis Mother   . Thyroid disease Mother   . Hypertension Father   . Thyroid disease Father    History  Substance Use Topics  . Smoking status: Former Games developer  . Smokeless tobacco: Former Neurosurgeon    Types: Snuff    Quit date: 08/15/2011  . Alcohol Use: No    Review of Systems  All other systems reviewed and are negative.    Allergies  Amoxicillin; Cephalosporins; and Penicillins  Home Medications   Current Outpatient Rx  Name  Route  Sig  Dispense  Refill  . ALPRAZolam (XANAX) 0.5 MG tablet   Oral   Take 1 tablet (0.5 mg total) by mouth 2 (two) times daily as needed for sleep.   60 tablet   3   . dexlansoprazole (DEXILANT) 60 MG capsule   Oral   Take 60 mg by mouth daily.         . sertraline (ZOLOFT) 50 MG tablet   Oral   Take 1 tablet (50 mg total) by mouth daily.   30 tablet   3    BP 131/87  Pulse 87  Temp(Src) 98.5 F (36.9 C) (Oral)  SpO2 100% Physical Exam  Nursing note and vitals reviewed. Constitutional: He is oriented to  person, place, and time. He appears well-developed and well-nourished.  HENT:  Head: Normocephalic and atraumatic.  Right Ear: External ear normal.  Left Ear: External ear normal.  Nose: Nose normal.  Mouth/Throat: Oropharynx is clear and moist.  Eyes: Conjunctivae and EOM are normal. Pupils are equal, round, and reactive to light.  Neck: Normal range of motion. Neck supple.  Cardiovascular: Normal rate, regular rhythm, normal heart sounds and intact distal pulses.   Pulmonary/Chest: Effort normal and breath sounds normal. No respiratory distress. He has no wheezes. He exhibits no tenderness.  Abdominal: Soft. Bowel sounds are normal. He exhibits no distension and no mass. There is no guarding.  Mild right ttp and right cva ttp.   Musculoskeletal: Normal range of motion.  Neurological: He is alert and oriented to person, place, and time. He has normal reflexes. He exhibits normal muscle tone. Coordination normal.  Skin: Skin is warm and dry.  Psychiatric: He has a normal mood and affect. His behavior is normal. Judgment and thought content normal.    ED Course   Procedures (including critical care time)  Labs Reviewed  URINALYSIS, ROUTINE W REFLEX MICROSCOPIC - Abnormal; Notable for the following:    Color, Urine AMBER (*)  APPearance CLOUDY (*)    Hgb urine dipstick LARGE (*)    Bilirubin Urine SMALL (*)    Ketones, ur 15 (*)    Protein, ur 30 (*)    Leukocytes, UA TRACE (*)    All other components within normal limits  URINE MICROSCOPIC-ADD ON - Abnormal; Notable for the following:    Squamous Epithelial / LPF FEW (*)    All other components within normal limits   Results for orders placed during the hospital encounter of 03/02/13  URINALYSIS, ROUTINE W REFLEX MICROSCOPIC      Result Value Range   Color, Urine AMBER (*) YELLOW   APPearance CLOUDY (*) CLEAR   Specific Gravity, Urine 1.028  1.005 - 1.030   pH 6.0  5.0 - 8.0   Glucose, UA NEGATIVE  NEGATIVE mg/dL   Hgb  urine dipstick LARGE (*) NEGATIVE   Bilirubin Urine SMALL (*) NEGATIVE   Ketones, ur 15 (*) NEGATIVE mg/dL   Protein, ur 30 (*) NEGATIVE mg/dL   Urobilinogen, UA 1.0  0.0 - 1.0 mg/dL   Nitrite NEGATIVE  NEGATIVE   Leukocytes, UA TRACE (*) NEGATIVE  URINE MICROSCOPIC-ADD ON      Result Value Range   Squamous Epithelial / LPF FEW (*) RARE   WBC, UA 0-2  <3 WBC/hpf   RBC / HPF TOO NUMEROUS TO COUNT  <3 RBC/hpf   Bacteria, UA RARE  RARE   Urine-Other MUCOUS PRESENT     v Ct Abdomen Pelvis Wo Contrast  03/02/2013   *RADIOLOGY REPORT*  Clinical Data: Bilateral flank pain.  CT ABDOMEN AND PELVIS WITHOUT CONTRAST  Technique:  Multidetector CT imaging of the abdomen and pelvis was performed following the standard protocol without intravenous contrast.  Comparison: None.  Findings: The lung bases are clear.  No pleural or pericardial effusion.  A 0.2 cm stone seen over the right psoas muscle may be within the right ureter.  There is no right hydronephrosis or perirenal or periureteral stranding.  A 0.3 cm nonobstructing stone is identified in the upper pole of the right kidney.  No left urinary tract stones are identified.  The gallbladder, liver, spleen and adrenal glands appear normal.  The stomach, small and large bowel and appendix appear normal.  No focal bony abnormality is identified.  IMPRESSION:  1.  0.2 cm calcification over the right psoas may be a ureteral stone but there is no right hydronephrosis or perirenal stranding as is typically seen with a ureteral stone. 2.  0.3 nonobstructing stone upper pole right kidney.   Original Report Authenticated By: Holley Dexter, M.D.   No diagnosis found.  MDM  Patient improved with iv toradol.  Plan urine culture and keflex.  He has symtpoms c.w. Kidney stones and possible kidney stone on ct but no hydro.  Plan symptomatic tx and referral to urology for follow up pain and hematuria.   Hilario Quarry, MD 03/02/13 [id number]

## 2013-03-02 NOTE — ED Notes (Signed)
Pt amb to triage with quick steady gait in nad. Pt reports hx of renal stones as a child, today noticed brb in his urine. Pt states he has been having right flank pain x this am.

## 2013-05-18 ENCOUNTER — Other Ambulatory Visit: Payer: Self-pay

## 2013-08-27 ENCOUNTER — Other Ambulatory Visit: Payer: Self-pay | Admitting: Family Medicine

## 2013-08-27 ENCOUNTER — Ambulatory Visit (INDEPENDENT_AMBULATORY_CARE_PROVIDER_SITE_OTHER): Payer: BC Managed Care – PPO | Admitting: Internal Medicine

## 2013-08-27 VITALS — BP 132/76 | HR 83 | Temp 98.1°F | Resp 16 | Ht 69.25 in | Wt 280.2 lb

## 2013-08-27 DIAGNOSIS — Z0000 Encounter for general adult medical examination without abnormal findings: Secondary | ICD-10-CM

## 2013-08-27 DIAGNOSIS — F410 Panic disorder [episodic paroxysmal anxiety] without agoraphobia: Secondary | ICD-10-CM

## 2013-08-27 LAB — COMPREHENSIVE METABOLIC PANEL
ALBUMIN: 4.6 g/dL (ref 3.5–5.2)
ALT: 20 U/L (ref 0–53)
AST: 19 U/L (ref 0–37)
Alkaline Phosphatase: 62 U/L (ref 39–117)
BUN: 11 mg/dL (ref 6–23)
CALCIUM: 10.2 mg/dL (ref 8.4–10.5)
CHLORIDE: 104 meq/L (ref 96–112)
CO2: 28 meq/L (ref 19–32)
CREATININE: 0.81 mg/dL (ref 0.50–1.35)
Glucose, Bld: 89 mg/dL (ref 70–99)
Potassium: 4.8 mEq/L (ref 3.5–5.3)
SODIUM: 139 meq/L (ref 135–145)
TOTAL PROTEIN: 7.3 g/dL (ref 6.0–8.3)
Total Bilirubin: 0.5 mg/dL (ref 0.2–1.2)

## 2013-08-27 LAB — POCT CBC
GRANULOCYTE PERCENT: 67.9 % (ref 37–80)
HCT, POC: 47.8 % (ref 43.5–53.7)
Hemoglobin: 15.8 g/dL (ref 14.1–18.1)
Lymph, poc: 2.3 (ref 0.6–3.4)
MCH, POC: 30.8 pg (ref 27–31.2)
MCHC: 33.1 g/dL (ref 31.8–35.4)
MCV: 93.2 fL (ref 80–97)
MID (CBC): 0.6 (ref 0–0.9)
MPV: 8.3 fL (ref 0–99.8)
PLATELET COUNT, POC: 390 10*3/uL (ref 142–424)
POC Granulocyte: 6 (ref 2–6.9)
POC LYMPH %: 25.6 % (ref 10–50)
POC MID %: 6.5 %M (ref 0–12)
RBC: 5.13 M/uL (ref 4.69–6.13)
RDW, POC: 13.5 %
WBC: 8.9 10*3/uL (ref 4.6–10.2)

## 2013-08-27 LAB — LIPID PANEL
Cholesterol: 177 mg/dL (ref 0–200)
HDL: 37 mg/dL — ABNORMAL LOW (ref >39)
LDL CALC: 111 mg/dL — AB (ref 0–99)
TRIGLYCERIDES: 145 mg/dL (ref <150)
Total CHOL/HDL Ratio: 4.8 Ratio
VLDL: 29 mg/dL (ref 0–40)

## 2013-08-27 NOTE — Progress Notes (Addendum)
Subjective:    Patient ID: Peter Owens., male    DOB: 1986-01-05, 28 y.o.   MRN: [id number]  HPI This chart was scribed for Ellamae Sia, MD by Andrew Au, ED Scribe. This patient was seen in room 12 and the patient's care was started at 9:49 AM.  HPI Comments: Peter Owens. is a 28 y.o. male who presents to the [facility] for a physical exam. 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 He reports that he is in good health condition except for intermittent anxiety onset 1 week. Marland Kitchen He denies difficulty sleeping. He states that he has increased heart rate, chest tightness and that his mind races when anxious. He denies his wife making him anxious. He states that when he becomes anxious at work he stops what he is doing to calm his mind. He states that he has leftover xanax from a previous illness that he takes for his anxiety but does not do this often. Pt has family h/o of heart problems and is concerned when to start seeing a cardiologist. This anxiety has a pattern of occurring every winter without clear changes in his life or with new precipitating events. No depression.  Pt is also complaining of headaches behind his eyes. He believes they are from his anxiety. He reports joint pain in knees and lower back pain. He states they come about when sitting for long periods of time. Pt reports that he has intermittent abdominal pain from GERD. He describes the pain as burning. Pt is UTD on flu shot.  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 Allergy   . GERD (gastroesophageal reflux disease)   . Arthritis   . Anxiety    Allergies  Allergen Reactions  . Amoxicillin   . Cephalosporins Hives  . Penicillins Hives    childhood   Prior to Admission medications   Medication Sig Start Date End Date Taking? Authorizing Provider  esomeprazole (NEXIUM) 40 MG capsule Take 40 mg by mouth  daily at 12 noon.   Yes Historical Provider, MD  ALPRAZolam Prudy Feeler) 0.5 MG tablet Take 1 tablet (0.5 mg total) by mouth 2 (two) times daily as needed for sleep. 01/28/13  rare use   Elvina Sidle, MD  sertraline (ZOLOFT) 50 MG tablet Take 1 tablet (50 mg total) by mouth daily. 01/28/13  discontinued   Elvina Sidle, MD   Review of Systems  Constitutional: Negative for fever and chills.  HENT: Negative.   Eyes: Negative.   Respiratory: Positive for chest tightness.   Gastrointestinal: Positive for abdominal pain.       GERD controlled on Nexium  Endocrine: Negative.   Genitourinary: Negative.   Musculoskeletal: Positive for arthralgias and back pain.  Allergic/Immunologic: Negative.   Neurological: Negative.   Hematological: Negative.   Psychiatric/Behavioral: Negative for sleep disturbance. The patient is nervous/anxious.       Objective:   Physical Exam  Nursing note and vitals reviewed. Constitutional: He is oriented to person, place, and time. He appears well-developed and well-nourished. No distress.  HENT:  Head: Normocephalic and atraumatic.  Right Ear: External ear normal.  Left Ear: External ear normal.  Nose: Nose normal.  Mouth/Throat: Oropharynx is clear and moist.  Eyes: Conjunctivae and EOM are normal. Pupils are equal, round, and reactive to light.  Neck: Normal range of motion. Neck supple. No thyromegaly present.  Cardiovascular: Normal rate, regular rhythm, normal  heart sounds and intact distal pulses.   No murmur heard. Pulmonary/Chest: Effort normal and breath sounds normal. No respiratory distress.  Abdominal: Soft. Bowel sounds are normal. He exhibits no mass. There is no tenderness.  Musculoskeletal: Normal range of motion. He exhibits no edema and no tenderness.  Lymphadenopathy:    He has no cervical adenopathy.  Neurological: He is alert and oriented to person, place, and time. He has normal reflexes. No cranial nerve deficit.  Skin: Skin is warm and  dry. No rash noted.  Psychiatric: He has a normal mood and affect. His behavior is normal. Judgment and thought content normal.   Filed Vitals:   08/27/13 1049  BP: 132/76  Pulse: 83  Temp: 98.1 F (36.7 C)  Resp: 16      Assessment & Plan:  I have completed the patient encounter in its entirety as documented by the scribe, with editing by me where necessary. Andrey Hoobler P. Merla Richesoolittle, M.D. Routine general medical examination at a health care facility - Plan: POCT CBC, Comprehensive metabolic panel  Severe obesity (BMI >= 40) - Plan: POCT CBC, Lipid panel, Comprehensive metabolic panel  Anxiety disorder-seasonal  GERD Meds ordered this encounter  Medications  . esomeprazole (NEXIUM) 40 MG capsule    Sig: Take 40 mg by mouth daily at 12 noon.    -  will continue Xanax on a when necessary basis(1/2 x .5) --- Discussed behavioral therapy approaches   -  Discussed weight loss

## 2013-08-29 MED ORDER — ALPRAZOLAM 0.5 MG PO TABS: 0.5000 mg | ORAL_TABLET | Freq: Two times a day (BID) | ORAL | Status: DC | PRN

## 2013-08-29 NOTE — Telephone Encounter (Signed)
Faxed Rx that Peter Owens wrote from instr's on lab result note. I am refusing this one since already done.

## 2013-08-31 MED ORDER — ALPRAZOLAM 0.5 MG PO TABS: 0.5000 mg | ORAL_TABLET | Freq: Two times a day (BID) | ORAL | Status: DC | PRN

## 2013-08-31 NOTE — Addendum Note (Signed)
Addended by: Tonye PearsonOLITTLE, Aurelia Gras P on: 08/31/2013 10:52 AM   Modules accepted: Orders

## 2013-09-02 ENCOUNTER — Other Ambulatory Visit: Payer: Self-pay | Admitting: Internal Medicine

## 2013-09-02 DIAGNOSIS — F410 Panic disorder [episodic paroxysmal anxiety] without agoraphobia: Secondary | ICD-10-CM

## 2013-09-02 MED ORDER — ALPRAZOLAM 0.5 MG PO TABS: 0.5000 mg | ORAL_TABLET | Freq: Two times a day (BID) | ORAL | Status: DC | PRN

## 2013-12-31 ENCOUNTER — Emergency Department (HOSPITAL_BASED_OUTPATIENT_CLINIC_OR_DEPARTMENT_OTHER)
Admission: EM | Admit: 2013-12-31 | Discharge: 2013-12-31 | Disposition: A | Discharge status: Home / Self Care | Payer: BC Managed Care – PPO | Attending: Emergency Medicine | Admitting: Emergency Medicine

## 2013-12-31 ENCOUNTER — Encounter (HOSPITAL_BASED_OUTPATIENT_CLINIC_OR_DEPARTMENT_OTHER): Payer: Self-pay | Admitting: Emergency Medicine

## 2013-12-31 DIAGNOSIS — Z8739 Personal history of other diseases of the musculoskeletal system and connective tissue: Secondary | ICD-10-CM | POA: Insufficient documentation

## 2013-12-31 DIAGNOSIS — Z87891 Personal history of nicotine dependence: Secondary | ICD-10-CM | POA: Insufficient documentation

## 2013-12-31 DIAGNOSIS — Z792 Long term (current) use of antibiotics: Secondary | ICD-10-CM | POA: Insufficient documentation

## 2013-12-31 DIAGNOSIS — K219 Gastro-esophageal reflux disease without esophagitis: Secondary | ICD-10-CM | POA: Insufficient documentation

## 2013-12-31 DIAGNOSIS — Z79899 Other long term (current) drug therapy: Secondary | ICD-10-CM | POA: Insufficient documentation

## 2013-12-31 DIAGNOSIS — L237 Allergic contact dermatitis due to plants, except food: Secondary | ICD-10-CM

## 2013-12-31 DIAGNOSIS — L255 Unspecified contact dermatitis due to plants, except food: Secondary | ICD-10-CM | POA: Insufficient documentation

## 2013-12-31 DIAGNOSIS — F411 Generalized anxiety disorder: Secondary | ICD-10-CM | POA: Insufficient documentation

## 2013-12-31 DIAGNOSIS — Z880 Allergy status to penicillin: Secondary | ICD-10-CM | POA: Insufficient documentation

## 2013-12-31 MED ORDER — PREDNISONE 10 MG PO TABS: ORAL_TABLET | ORAL | Status: DC

## 2013-12-31 NOTE — Discharge Instructions (Signed)
       Poison Ivy Poison ivy is a inflammation of the skin (contact dermatitis) caused by touching the allergens on the leaves of the ivy plant following previous exposure to the plant. The rash usually appears 48 hours after exposure. The rash is usually bumps (papules) or blisters (vesicles) in a linear pattern. Depending on your own sensitivity, the rash may simply cause redness and itching, or it may also progress to blisters which may break open. These must be well cared for to prevent secondary bacterial (germ) infection, followed by scarring. Keep any open areas dry, clean, dressed, and covered with an antibacterial ointment if needed. The eyes may also get puffy. The puffiness is worst in the morning and gets better as the day progresses. This dermatitis usually heals without scarring, within 2 to 3 weeks without treatment. HOME CARE INSTRUCTIONS  Thoroughly wash with soap and water as soon as you have been exposed to poison ivy. You have about one half hour to remove the plant resin before it will cause the rash. This washing will destroy the oil or antigen on the skin that is causing, or will cause, the rash. Be sure to wash under your fingernails as any plant resin there will continue to spread the rash. Do not rub skin vigorously when washing affected area. Poison ivy cannot spread if no oil from the plant remains on your body. A rash that has progressed to weeping sores will not spread the rash unless you have not washed thoroughly. It is also important to wash any clothes you have been wearing as these may carry active allergens. The rash will return if you wear the unwashed clothing, even several days later. Avoidance of the plant in the future is the best measure. Poison ivy plant can be recognized by the number of leaves. Generally, poison ivy has three leaves with flowering branches on a single stem. Diphenhydramine may be purchased over the counter and used as needed for itching. Do not  drive with this medication if it makes you drowsy.Ask your caregiver about medication for children. SEEK MEDICAL CARE IF:  Open sores develop.  Redness spreads beyond area of rash.  You notice purulent (pus-like) discharge.  You have increased pain.  Other signs of infection develop (such as fever). Document Released: 06/26/2000 Document Revised: 09/21/2011 Document Reviewed: 05/15/2009 ExitCare Patient Information 2015 [employer]. This information is not intended to replace advice given to you by your health care provider. Make sure you discuss any questions you have with your health care provider.  

## 2013-12-31 NOTE — ED Provider Notes (Signed)
CSN: [id number]     Arrival date & time 12/31/13  1922 History   First MD Initiated Contact with Patient 12/31/13 2015     Chief Complaint  Patient presents with  . Poison Oak     (Consider location/radiation/quality/duration/timing/severity/associated sxs/prior Treatment) Patient is a 28 y.o. male presenting with rash. The history is provided by the patient. No language interpreter was used.  Rash Location:  Full body Quality: itchiness and redness   Severity:  Moderate Onset quality:  Gradual Duration:  3 days Timing:  Constant Progression:  Worsening Chronicity:  New Context: plant contact   Relieved by:  Nothing Worsened by:  Nothing tried Ineffective treatments:  None tried   Past Medical History  Diagnosis Date  . Allergy   . GERD (gastroesophageal reflux disease)   . Arthritis   . Anxiety    History reviewed. No pertinent past surgical history. Family History  Problem Relation Age of Onset  . Diabetes Mother   . Hypertension Mother   . Heart disease Mother   . Arthritis Mother   . Thyroid disease Mother   . Hypertension Father   . Thyroid disease Father   . Heart disease Father   . Hyperlipidemia Mother   . Hyperlipidemia Father   . Mental illness Mother   . Mental illness Father   . Hypertension Sister   . Mental illness Sister   . Hyperlipidemia Maternal Grandmother   . Hypertension Maternal Grandmother   . Heart disease Maternal Grandmother   . Cancer Maternal Grandfather   . Diabetes Maternal Grandfather   . Hyperlipidemia Maternal Grandfather   . Hypertension Maternal Grandfather   . Heart disease Paternal Grandmother   . Hyperlipidemia Paternal Grandmother   . Hypertension Paternal Grandmother   . Hyperlipidemia Paternal Grandfather   . Hyperlipidemia Paternal Grandfather   . Hypertension Paternal Grandfather   . Heart disease Paternal Grandfather    History  Substance Use Topics  . Smoking status: Former Games developermoker  . Smokeless tobacco:  Former NeurosurgeonUser    Types: Snuff    Quit date: 08/15/2011  . Alcohol Use: No    Review of Systems  Skin: Positive for rash.  All other systems reviewed and are negative.     Allergies  Amoxicillin; Cephalosporins; and Penicillins  Home Medications   Prior to Admission medications   Medication Sig Start Date End Date Taking? Authorizing Provider  ALPRAZolam Prudy Feeler(XANAX) 0.5 MG tablet Take 1 tablet (0.5 mg total) by mouth 2 (two) times daily as needed for anxiety. 09/02/13  Yes Tonye Pearsonobert P Doolittle, MD  esomeprazole (NEXIUM) 40 MG capsule Take 40 mg by mouth daily at 12 noon.   Yes Historical Provider, MD  ciprofloxacin (CIPRO) 500 MG tablet Take 1 tablet (500 mg total) by mouth every 12 (twelve) hours. 03/02/13   Hilario Quarryanielle S Ray, MD  dexlansoprazole (DEXILANT) 60 MG capsule Take 60 mg by mouth daily.    Historical Provider, MD  ondansetron (ZOFRAN ODT) 8 MG disintegrating tablet Take 1 tablet (8 mg total) by mouth every 8 (eight) hours as needed for nausea. 03/02/13   Hilario Quarryanielle S Ray, MD  oxyCODONE-acetaminophen (PERCOCET/ROXICET) 5-325 MG per tablet Take 2 tablets by mouth every 4 (four) hours as needed for pain. 03/02/13   Hilario Quarryanielle S Ray, MD  predniSONE (DELTASONE) 10 MG tablet 6,6,5,5,4,4,3,3,2,2,1,1  taper 12/31/13   Elson AreasLeslie K Sofia, PA-C  sertraline (ZOLOFT) 50 MG tablet Take 1 tablet (50 mg total) by mouth daily. 01/28/13   Elvina SidleKurt Lauenstein, MD  BP 131/78  Pulse 78  Resp 17  Ht 5\' 11"  (1.803 m)  Wt 275 lb (124.739 kg)  BMI 38.37 kg/m2  SpO2 99% Physical Exam  Nursing note and vitals reviewed. Constitutional: He is oriented to person, place, and time. He appears well-developed and well-nourished.  HENT:  Head: Normocephalic and atraumatic.  Eyes: EOM are normal. Pupils are equal, round, and reactive to light.  Neck: Normal range of motion.  Cardiovascular: Normal rate and normal heart sounds.   Pulmonary/Chest: Effort normal.  Abdominal: Soft. He exhibits no distension.  Musculoskeletal:  Normal range of motion.  Neurological: He is alert and oriented to person, place, and time.  Skin: Rash noted.  Multiple linear red rashed rash areas,  Some crusting,    Psychiatric: He has a normal mood and affect.    ED Course  Procedures (including critical care time) Labs Review Labs Reviewed - No data to display  Imaging Review No results found.   EKG Interpretation None      MDM Pt has extensive poison ivy,  Arms, legs, abdomen.   I will treat with extended prednisone    Final diagnoses:  Poison ivy   Prednisone 10 taper [address]42     Lonia SkinnerLeslie K ChapmanSofia, [place] JerseyPA-C 12/31/13 2142

## 2013-12-31 NOTE — ED Notes (Signed)
Pt reports poison oak to arms, trunk, neck, legs, ears, back.

## 2014-01-01 NOTE — ED Provider Notes (Signed)
Medical screening examination/treatment/procedure(s) were performed by non-physician practitioner and as supervising physician I was immediately available for consultation/collaboration.   EKG Interpretation None        Glynn OctaveStephen Rancour, MD 01/01/14 [phone]

## 2015-04-09 ENCOUNTER — Ambulatory Visit (INDEPENDENT_AMBULATORY_CARE_PROVIDER_SITE_OTHER): Payer: BLUE CROSS/BLUE SHIELD | Admitting: Emergency Medicine

## 2015-04-09 VITALS — BP 128/80 | HR 94 | Temp 98.6°F | Resp 18 | Ht 71.0 in | Wt 290.0 lb

## 2015-04-09 DIAGNOSIS — Z0000 Encounter for general adult medical examination without abnormal findings: Secondary | ICD-10-CM | POA: Diagnosis not present

## 2015-04-09 DIAGNOSIS — Z23 Encounter for immunization: Secondary | ICD-10-CM | POA: Diagnosis not present

## 2015-04-09 LAB — POCT URINALYSIS DIP (MANUAL ENTRY)
Bilirubin, UA: NEGATIVE
GLUCOSE UA: NEGATIVE
Ketones, POC UA: NEGATIVE
Leukocytes, UA: NEGATIVE
Nitrite, UA: NEGATIVE
PROTEIN UA: NEGATIVE
SPEC GRAV UA: 1.02
UROBILINOGEN UA: 1
pH, UA: 7

## 2015-04-09 LAB — POC MICROSCOPIC URINALYSIS (UMFC)

## 2015-04-09 NOTE — Patient Instructions (Signed)

## 2015-04-09 NOTE — Progress Notes (Signed)
Subjective:  Patient ID: Peter Daniel., male    DOB: 01/06/1986  Age: 29 y.o. MRN: [id number]  CC: Annual Exam and Flu Vaccine   HPI Peter Owens. presents  for physical examination. He is not fasting and is complaining currently of discomfort in his right testicle. He has no bulging or mass. No tenderness. He has no dysuria urgency or frequency no urethral discharge. Has no fever or chills. He has no history of injury. No history of heavy lifting. No history of hernia.  History Peter Owens has a past medical history of Allergy; GERD (gastroesophageal reflux disease); Arthritis; and Anxiety.   He has past surgical history that includes Tympanostomy tube placement.   His  family history includes Arthritis in his mother; Cancer in his maternal grandfather; Diabetes in his maternal grandfather and mother; Heart disease in his father, maternal grandmother, mother, paternal grandfather, and paternal grandmother; Hyperlipidemia in his father, maternal grandfather, maternal grandmother, mother, paternal grandfather, paternal grandfather, and paternal grandmother; Hypertension in his father, maternal grandfather, maternal grandmother, mother, paternal grandfather, paternal grandmother, and sister; Mental illness in his father, mother, and sister; Thyroid disease in his father and mother.  He   reports that he has quit smoking. He quit smokeless tobacco use about 3 years ago. His smokeless tobacco use included Snuff. He reports that he does not drink alcohol or use illicit drugs.  Outpatient Prescriptions Prior to Visit  Medication Sig Dispense Refill  . esomeprazole (NEXIUM) 40 MG capsule Take 40 mg by mouth daily at 12 noon.    Marland Kitchen ALPRAZolam (XANAX) 0.5 MG tablet Take 1 tablet (0.5 mg total) by mouth 2 (two) times daily as needed for anxiety. (Patient not taking: Reported on 04/09/2015) 60 tablet 2  . ciprofloxacin (CIPRO) 500 MG tablet Take 1 tablet (500 mg total) by mouth every 12  (twelve) hours. (Patient not taking: Reported on 04/09/2015) 10 tablet 0  . dexlansoprazole (DEXILANT) 60 MG capsule Take 60 mg by mouth daily.    . ondansetron (ZOFRAN ODT) 8 MG disintegrating tablet Take 1 tablet (8 mg total) by mouth every 8 (eight) hours as needed for nausea. (Patient not taking: Reported on 04/09/2015) 20 tablet 0  . oxyCODONE-acetaminophen (PERCOCET/ROXICET) 5-325 MG per tablet Take 2 tablets by mouth every 4 (four) hours as needed for pain. (Patient not taking: Reported on 04/09/2015) 15 tablet 0  . predniSONE (DELTASONE) 10 MG tablet 6,6,5,5,4,4,3,3,2,2,1,1  taper (Patient not taking: Reported on 04/09/2015) 42 tablet 0  . sertraline (ZOLOFT) 50 MG tablet Take 1 tablet (50 mg total) by mouth daily. (Patient not taking: Reported on 04/09/2015) 30 tablet 3   No facility-administered medications prior to visit.    Social History   Social History  . Marital Status: Married    Spouse Name: N/A  . Number of Children: N/A  . Years of Education: N/A   Social History Main Topics  . Smoking status: Former Games developer  . Smokeless tobacco: Former Neurosurgeon    Types: Snuff    Quit date: 08/15/2011  . Alcohol Use: No  . Drug Use: No  . Sexual Activity: Not Asked   Other Topics Concern  . None   Social History Narrative     Review of Systems  Constitutional: Negative for fever, chills and appetite change.  HENT: Negative for congestion, ear pain, postnasal drip, sinus pressure and sore throat.   Eyes: Negative for pain and redness.  Respiratory: Negative for cough, shortness of breath and wheezing.  Cardiovascular: Negative for leg swelling.  Gastrointestinal: Negative for nausea, vomiting, abdominal pain, diarrhea, constipation and blood in stool.  Endocrine: Negative for polyuria.  Genitourinary: Positive for testicular pain. Negative for dysuria, urgency, frequency and flank pain.  Musculoskeletal: Negative for gait problem.  Skin: Negative for rash.  Neurological:  Negative for weakness and headaches.  Psychiatric/Behavioral: Negative for confusion and decreased concentration. The patient is not nervous/anxious.     Objective:  BP 128/80 mmHg  Pulse 94  Temp(Src) 98.6 F (37 C) (Oral)  Resp 18  Ht  (1.803 m)  Wt 290 lb (131.543 kg)  BMI 40.46 kg/m2  SpO2 99%  Physical Exam  Constitutional: He is oriented to person, place, and time. He appears well-developed and well-nourished. No distress.  HENT:  Head: Normocephalic and atraumatic.  Right Ear: External ear normal.  Left Ear: External ear normal.  Nose: Nose normal.  Eyes: Conjunctivae and EOM are normal. Pupils are equal, round, and reactive to light. No scleral icterus.  Neck: Normal range of motion. Neck supple. No tracheal deviation present.  Cardiovascular: Normal rate, regular rhythm and normal heart sounds.   Pulmonary/Chest: Effort normal. No respiratory distress. He has no wheezes. He has no rales.  Abdominal: He exhibits no mass. There is no tenderness. There is no rebound and no guarding. Hernia confirmed negative in the right inguinal area and confirmed negative in the left inguinal area.  Genitourinary: Testes normal and penis normal. Circumcised.  Musculoskeletal: He exhibits no edema.  Lymphadenopathy:    He has no cervical adenopathy.  Neurological: He is alert and oriented to person, place, and time. Coordination normal.  Skin: Skin is warm and dry. No rash noted.  Psychiatric: He has a normal mood and affect. His behavior is normal.      Assessment & Plan:   Peter Owens was seen today for annual exam and flu vaccine.  Diagnoses and all orders for this visit:  Routine general medical examination at a health care facility -     CBC -     Comprehensive metabolic panel -     TSH -     Lipid panel -     POCT Microscopic Urinalysis ([facility]) -     POCT urinalysis dipstick -     GC/Chlamydia Probe Amp  Needs flu shot -     Flu Vaccine QUAD 36+ mos IM -     CBC -      Comprehensive metabolic panel -     TSH -     Lipid panel -     POCT Microscopic Urinalysis ([facility]) -     POCT urinalysis dipstick -     GC/Chlamydia Probe Amp  Severe obesity (BMI >= 40) -     CBC -     Comprehensive metabolic panel -     TSH -     Lipid panel -     POCT Microscopic Urinalysis ([facility]) -     POCT urinalysis dipstick -     GC/Chlamydia Probe Amp   I have discontinued Mr. Appleby dexlansoprazole, sertraline, oxyCODONE-acetaminophen, ondansetron, ciprofloxacin, ALPRAZolam, and predniSONE. I am also having him maintain his esomeprazole.  No orders of the defined types were placed in this encounter.    Appropriate red flag conditions were discussed with the patient as well as actions that should be taken.  Patient expressed his understanding.  Follow-up: Return if symptoms worsen or fail to improve.  Carmelina Dane, MD

## 2015-04-10 LAB — LIPID PANEL
CHOLESTEROL: 192 mg/dL (ref 125–200)
HDL: 35 mg/dL — AB (ref >=40)
LDL Cholesterol: 111 mg/dL (ref <130)
Total CHOL/HDL Ratio: 5.5 Ratio — ABNORMAL HIGH (ref <=5.0)
Triglycerides: 229 mg/dL — ABNORMAL HIGH (ref <150)
VLDL: 46 mg/dL — ABNORMAL HIGH (ref <30)

## 2015-04-10 LAB — COMPREHENSIVE METABOLIC PANEL
ALBUMIN: 4.7 g/dL (ref 3.6–5.1)
ALT: 16 U/L (ref 9–46)
AST: 17 U/L (ref 10–40)
Alkaline Phosphatase: 63 U/L (ref 40–115)
BILIRUBIN TOTAL: 0.5 mg/dL (ref 0.2–1.2)
BUN: 13 mg/dL (ref 7–25)
CALCIUM: 9.7 mg/dL (ref 8.6–10.3)
CHLORIDE: 103 mmol/L (ref 98–110)
CO2: 28 mmol/L (ref 20–31)
Creat: 0.89 mg/dL (ref 0.60–1.35)
GLUCOSE: 90 mg/dL (ref 65–99)
Potassium: 4.5 mmol/L (ref 3.5–5.3)
SODIUM: 141 mmol/L (ref 135–146)
Total Protein: 7.6 g/dL (ref 6.1–8.1)

## 2015-04-10 LAB — CBC
HEMATOCRIT: 43.6 % (ref 39.0–52.0)
Hemoglobin: 15.5 g/dL (ref 13.0–17.0)
MCH: 30.5 pg (ref 26.0–34.0)
MCHC: 35.6 g/dL (ref 30.0–36.0)
MCV: 85.7 fL (ref 78.0–100.0)
MPV: 8.9 fL (ref 8.6–12.4)
Platelets: 396 10*3/uL (ref 150–400)
RBC: 5.09 MIL/uL (ref 4.22–5.81)
RDW: 12.9 % (ref 11.5–15.5)
WBC: 11.4 10*3/uL — AB (ref 4.0–10.5)

## 2015-04-10 LAB — TSH: TSH: 1.96 u[IU]/mL (ref 0.350–4.500)

## 2015-04-11 LAB — GC/CHLAMYDIA PROBE AMP
CT PROBE, AMP APTIMA: NEGATIVE
GC PROBE AMP APTIMA: NEGATIVE

## 2015-07-18 ENCOUNTER — Emergency Department (HOSPITAL_BASED_OUTPATIENT_CLINIC_OR_DEPARTMENT_OTHER)
Admission: EM | Admit: 2015-07-18 | Discharge: 2015-07-18 | Disposition: A | Discharge status: Home / Self Care | Payer: BLUE CROSS/BLUE SHIELD | Attending: Emergency Medicine | Admitting: Emergency Medicine

## 2015-07-18 ENCOUNTER — Encounter (HOSPITAL_BASED_OUTPATIENT_CLINIC_OR_DEPARTMENT_OTHER): Payer: Self-pay

## 2015-07-18 DIAGNOSIS — R319 Hematuria, unspecified: Secondary | ICD-10-CM | POA: Diagnosis not present

## 2015-07-18 DIAGNOSIS — Z880 Allergy status to penicillin: Secondary | ICD-10-CM | POA: Insufficient documentation

## 2015-07-18 DIAGNOSIS — Z87891 Personal history of nicotine dependence: Secondary | ICD-10-CM | POA: Diagnosis not present

## 2015-07-18 DIAGNOSIS — M549 Dorsalgia, unspecified: Secondary | ICD-10-CM | POA: Insufficient documentation

## 2015-07-18 DIAGNOSIS — Z8659 Personal history of other mental and behavioral disorders: Secondary | ICD-10-CM | POA: Insufficient documentation

## 2015-07-18 DIAGNOSIS — Z8739 Personal history of other diseases of the musculoskeletal system and connective tissue: Secondary | ICD-10-CM | POA: Diagnosis not present

## 2015-07-18 DIAGNOSIS — R112 Nausea with vomiting, unspecified: Secondary | ICD-10-CM | POA: Insufficient documentation

## 2015-07-18 DIAGNOSIS — Z8719 Personal history of other diseases of the digestive system: Secondary | ICD-10-CM | POA: Insufficient documentation

## 2015-07-18 DIAGNOSIS — R109 Unspecified abdominal pain: Secondary | ICD-10-CM | POA: Insufficient documentation

## 2015-07-18 DIAGNOSIS — Z87442 Personal history of urinary calculi: Secondary | ICD-10-CM | POA: Diagnosis not present

## 2015-07-18 DIAGNOSIS — R300 Dysuria: Secondary | ICD-10-CM | POA: Insufficient documentation

## 2015-07-18 LAB — URINALYSIS, ROUTINE W REFLEX MICROSCOPIC
Bilirubin Urine: NEGATIVE
GLUCOSE, UA: NEGATIVE mg/dL
Ketones, ur: 15 mg/dL — AB
Leukocytes, UA: NEGATIVE
Nitrite: NEGATIVE
Protein, ur: 30 mg/dL — AB
SPECIFIC GRAVITY, URINE: 1.028 (ref 1.005–1.030)
pH: 6.5 (ref 5.0–8.0)

## 2015-07-18 LAB — COMPREHENSIVE METABOLIC PANEL
ALBUMIN: 4.5 g/dL (ref 3.5–5.0)
ALT: 20 U/L (ref 17–63)
ANION GAP: 5 (ref 5–15)
AST: 19 U/L (ref 15–41)
Alkaline Phosphatase: 64 U/L (ref 38–126)
BILIRUBIN TOTAL: 0.8 mg/dL (ref 0.3–1.2)
BUN: 13 mg/dL (ref 6–20)
CHLORIDE: 103 mmol/L (ref 101–111)
CO2: 27 mmol/L (ref 22–32)
Calcium: 9 mg/dL (ref 8.9–10.3)
Creatinine, Ser: 0.84 mg/dL (ref 0.61–1.24)
GFR calc Af Amer: >60 mL/min (ref >60)
GFR calc non Af Amer: >60 mL/min (ref >60)
GLUCOSE: 114 mg/dL — AB (ref 65–99)
POTASSIUM: 4 mmol/L (ref 3.5–5.1)
SODIUM: 135 mmol/L (ref 135–145)
TOTAL PROTEIN: 7.7 g/dL (ref 6.5–8.1)

## 2015-07-18 LAB — URINE MICROSCOPIC-ADD ON: WBC, UA: NONE SEEN WBC/hpf (ref 0–5)

## 2015-07-18 MED ORDER — TAMSULOSIN HCL 0.4 MG PO CAPS: 0.4000 mg | ORAL_CAPSULE | Freq: Two times a day (BID) | ORAL | Status: DC

## 2015-07-18 MED ORDER — ONDANSETRON HCL 4 MG PO TABS: 4.0000 mg | ORAL_TABLET | Freq: Four times a day (QID) | ORAL | Status: DC

## 2015-07-18 MED ORDER — ONDANSETRON HCL 4 MG/2ML IJ SOLN
4.0000 mg | Freq: Once | INTRAMUSCULAR | Status: AC
  Administered 2015-07-18: 4 mg via INTRAVENOUS
  Filled 2015-07-18: qty 2

## 2015-07-18 MED ORDER — KETOROLAC TROMETHAMINE 30 MG/ML IJ SOLN
30.0000 mg | Freq: Once | INTRAMUSCULAR | Status: AC
  Administered 2015-07-18: 30 mg via INTRAVENOUS
  Filled 2015-07-18: qty 1

## 2015-07-18 MED ORDER — OXYCODONE-ACETAMINOPHEN 5-325 MG PO TABS
1.0000 | ORAL_TABLET | Freq: Four times a day (QID) | ORAL | Status: DC | PRN
Start: 2015-07-18 — End: 2015-11-07

## 2015-07-18 NOTE — ED Notes (Signed)
Patient stable and ambulatory.  Patient verbalizes understanding of discharge medications, instructions and follow-up. 

## 2015-07-18 NOTE — ED Notes (Signed)
Right flank pain, hematuria x 4 hours-states feels like kidney stone-NAD

## 2015-07-18 NOTE — ED Provider Notes (Signed)
CSN: [id number]     Arrival date & time 07/18/15  1521 History   First MD Initiated Contact with Patient 07/18/15 1557     Chief Complaint  Patient presents with  . Flank Pain     (Consider location/radiation/quality/duration/timing/severity/associated sxs/prior Treatment) HPI Peter Owens. is a 30 y.o. male who comes in for evaluation of right-sided flank pain. Patient reports pain was sudden onset at approximately 12:00 this afternoon. Discomfort is located in his right back and radiates to his right groin. Reports he has had kidney stones in the past and the pain is similar, just more intense. Pain follows a waxing and waning pattern. Patient reports being unable to find a comfortable position. Reports associated nausea and vomiting as well as some dysuria and mild hematuria. Denies any rectal pain, diarrhea or constipation, abdominal pain, fevers or chills. Denies any discomfort now in the ED. Tried ibuprofen earlier with some relief. No other modifying factors.  Past Medical History  Diagnosis Date  . Allergy   . GERD (gastroesophageal reflux disease)   . Arthritis   . Anxiety   . Kidney stone    Past Surgical History  Procedure Laterality Date  . Tympanostomy tube placement     Family History  Problem Relation Age of Onset  . Diabetes Mother   . Hypertension Mother   . Heart disease Mother   . Arthritis Mother   . Thyroid disease Mother   . Hypertension Father   . Thyroid disease Father   . Heart disease Father   . Hyperlipidemia Mother   . Hyperlipidemia Father   . Mental illness Mother   . Mental illness Father   . Hypertension Sister   . Mental illness Sister   . Hyperlipidemia Maternal Grandmother   . Hypertension Maternal Grandmother   . Heart disease Maternal Grandmother   . Cancer Maternal Grandfather   . Diabetes Maternal Grandfather   . Hyperlipidemia Maternal Grandfather   . Hypertension Maternal Grandfather   . Heart disease Paternal Grandmother    . Hyperlipidemia Paternal Grandmother   . Hypertension Paternal Grandmother   . Hyperlipidemia Paternal Grandfather   . Hyperlipidemia Paternal Grandfather   . Hypertension Paternal Grandfather   . Heart disease Paternal Grandfather    Social History  Substance Use Topics  . Smoking status: Former Games developer  . Smokeless tobacco: Former Neurosurgeon    Quit date: 08/15/2011  . Alcohol Use: No    Review of Systems A 10 point review of systems was completed and was negative except for pertinent positives and negatives as mentioned in the history of present illness     Allergies  Amoxicillin; Cephalosporins; and Penicillins  Home Medications   Prior to Admission medications   Medication Sig Start Date End Date Taking? Authorizing Provider  ondansetron (ZOFRAN) 4 MG tablet Take 1 tablet (4 mg total) by mouth every 6 (six) hours. 07/18/15   Joycie Peek, PA-C  oxyCODONE-acetaminophen (PERCOCET/ROXICET) 5-325 MG tablet Take 1-2 tablets by mouth every 6 (six) hours as needed for severe pain. 07/18/15   Joycie Peek, PA-C  tamsulosin (FLOMAX) 0.4 MG CAPS capsule Take 1 capsule (0.4 mg total) by mouth 2 (two) times daily. 07/18/15   Lindaann Gradilla, PA-C   BP 123/56 mmHg  Pulse 73  Temp(Src) 98.2 F (36.8 C) (Oral)  Resp 20  Ht 5\' 11"  (1.803 m)  Wt 126.554 kg  BMI 38.93 kg/m2  SpO2 100% Physical Exam  Constitutional: He is oriented to person, place, and time.  He appears well-developed and well-nourished.  HENT:  Head: Normocephalic and atraumatic.  Mouth/Throat: Oropharynx is clear and moist.  Eyes: Conjunctivae are normal. Pupils are equal, round, and reactive to light. Right eye exhibits no discharge. Left eye exhibits no discharge. No scleral icterus.  Neck: Neck supple.  Cardiovascular: Normal rate, regular rhythm and normal heart sounds.   Pulmonary/Chest: Effort normal and breath sounds normal. No respiratory distress. He has no wheezes. He has no rales.  Abdominal: Soft. He  exhibits no distension and no mass. There is no tenderness. There is no rebound and no guarding.  No CVA tenderness  Musculoskeletal: He exhibits no tenderness.  Neurological: He is alert and oriented to person, place, and time.  Cranial Nerves II-XII grossly intact  Skin: Skin is warm and dry. No rash noted.  Psychiatric: He has a normal mood and affect.  Nursing note and vitals reviewed.   ED Course  Procedures (including critical care time) Labs Review Labs Reviewed  URINALYSIS, ROUTINE W REFLEX MICROSCOPIC (NOT AT [facility]) - Abnormal; Notable for the following:    APPearance CLOUDY (*)    Hgb urine dipstick LARGE (*)    Ketones, ur 15 (*)    Protein, ur 30 (*)    All other components within normal limits  URINE MICROSCOPIC-ADD ON - Abnormal; Notable for the following:    Squamous Epithelial / LPF 0-5 (*)    Bacteria, UA FEW (*)    All other components within normal limits  COMPREHENSIVE METABOLIC PANEL - Abnormal; Notable for the following:    Glucose, Bld 114 (*)    All other components within normal limits    Imaging Review No results found. I have personally reviewed and evaluated these images and lab results as part of my medical decision-making.   EKG Interpretation None     Meds given in ED:  Medications  ketorolac (TORADOL) 30 MG/ML injection 30 mg (30 mg Intravenous Given 07/18/15 1648)  ondansetron (ZOFRAN) injection 4 mg (4 mg Intravenous Given 07/18/15 1648)    Discharge Medication List as of 07/18/2015  5:12 PM    START taking these medications   Details  ondansetron (ZOFRAN) 4 MG tablet Take 1 tablet (4 mg total) by mouth every 6 (six) hours., Starting 07/18/2015, Until Discontinued, Print    oxyCODONE-acetaminophen (PERCOCET/ROXICET) 5-325 MG tablet Take 1-2 tablets by mouth every 6 (six) hours as needed for severe pain., Starting 07/18/2015, Until Discontinued, Print    tamsulosin (FLOMAX) 0.4 MG CAPS capsule Take 1 capsule (0.4 mg total) by mouth 2 (two)  times daily., Starting 07/18/2015, Until Discontinued, Print       Filed Vitals:   07/18/15 1528 07/18/15 1721  BP: 146/94 123/56  Pulse: 78 73  Temp: 98.2 F (36.8 C)   TempSrc: Oral   Resp: 18 20  Height: 5\' 11"  (1.803 m)   Weight: 126.554 kg   SpO2: 100% 100%    MDM  Patient presents for evaluation of right flank pain. Symptoms consistent with kidney stone. Hemoglobin on urinalysis without any evidence of infection. No CVA tenderness. No intractable vomiting. Low suspicion for pyelonephritis, urosepsis/infected stone. Creatinine within normal limits. Patient overall appears very well. Will DC with pain medicines, nausea medicines, Flomax and instructed to follow-up with PCP/urology. Referral given. Patient hemodynamically stable, afebrile and appropriate for discharge. The patient appears reasonably screened and/or stabilized for discharge and I doubt any other medical condition or other [facility] requiring further screening, evaluation, or treatment in the ED at this  time prior to discharge.   Final diagnoses:  Flank pain       Joycie Peek, PA-C 07/18/15 1743  Arby Barrette, MD 07/19/15 [phone]

## 2015-07-18 NOTE — Discharge Instructions (Signed)
Your symptoms are likely due to a kidney stone. Please take your medications as prescribed. Do not take your pain medicine before driving as it can make you very drowsy. Follow-up with your doctor or urology for reevaluation if your symptoms do not improve. Return to ED for any new or worsening symptoms as we discussed.  Flank Pain Flank pain refers to pain that is located on the side of the body between the upper abdomen and the back. The pain may occur over a short period of time (acute) or may be long-term or reoccurring (chronic). It may be mild or severe. Flank pain can be caused by many things. CAUSES  Some of the more common causes of flank pain include:  Muscle strains.   Muscle spasms.   A disease of your spine (vertebral disk disease).   A lung infection (pneumonia).   Fluid around your lungs (pulmonary edema).   A kidney infection.   Kidney stones.   A very painful skin rash caused by the chickenpox virus (shingles).   Gallbladder disease.  HOME CARE INSTRUCTIONS  Home care will depend on the cause of your pain. In general,  Rest as directed by your caregiver.  Drink enough fluids to keep your urine clear or pale yellow.  Only take over-the-counter or prescription medicines as directed by your caregiver. Some medicines may help relieve the pain.  Tell your caregiver about any changes in your pain.  Follow up with your caregiver as directed. SEEK IMMEDIATE MEDICAL CARE IF:   Your pain is not controlled with medicine.   You have new or worsening symptoms.  Your pain increases.   You have abdominal pain.   You have shortness of breath.   You have persistent nausea or vomiting.   You have swelling in your abdomen.   You feel faint or pass out.   You have blood in your urine.  You have a fever or persistent symptoms for more than 2-3 days.  You have a fever and your symptoms suddenly get worse. MAKE SURE YOU:   Understand these  instructions.  Will watch your condition.  Will get help right away if you are not doing well or get worse.   This information is not intended to replace advice given to you by your health care provider. Make sure you discuss any questions you have with your health care provider.   Document Released: 08/20/2005 Document Revised: 03/23/2012 Document Reviewed: 02/11/2012 Elsevier Interactive Patient Education [employer]2016 Elsevier Inc.

## 2015-11-07 ENCOUNTER — Other Ambulatory Visit: Payer: Self-pay | Admitting: Family Medicine

## 2015-11-07 ENCOUNTER — Ambulatory Visit (HOSPITAL_BASED_OUTPATIENT_CLINIC_OR_DEPARTMENT_OTHER)
Admission: RE | Admit: 2015-11-07 | Discharge: 2015-11-07 | Disposition: A | Discharge status: Home / Self Care | Payer: BLUE CROSS/BLUE SHIELD | Source: Ambulatory Visit | Attending: Family Medicine | Admitting: Family Medicine

## 2015-11-07 ENCOUNTER — Ambulatory Visit (INDEPENDENT_AMBULATORY_CARE_PROVIDER_SITE_OTHER): Payer: BLUE CROSS/BLUE SHIELD | Admitting: Family Medicine

## 2015-11-07 ENCOUNTER — Telehealth: Payer: Self-pay | Admitting: *Deleted

## 2015-11-07 VITALS — BP 134/76 | HR 76 | Temp 98.2°F | Resp 18 | Ht 71.5 in | Wt 290.0 lb

## 2015-11-07 DIAGNOSIS — R1011 Right upper quadrant pain: Secondary | ICD-10-CM | POA: Diagnosis not present

## 2015-11-07 DIAGNOSIS — R319 Hematuria, unspecified: Secondary | ICD-10-CM | POA: Diagnosis not present

## 2015-11-07 DIAGNOSIS — R109 Unspecified abdominal pain: Secondary | ICD-10-CM | POA: Diagnosis not present

## 2015-11-07 DIAGNOSIS — K760 Fatty (change of) liver, not elsewhere classified: Secondary | ICD-10-CM | POA: Insufficient documentation

## 2015-11-07 DIAGNOSIS — G8929 Other chronic pain: Secondary | ICD-10-CM

## 2015-11-07 LAB — POC MICROSCOPIC URINALYSIS (UMFC): Mucus: ABSENT

## 2015-11-07 LAB — POCT CBC
Granulocyte percent: 80.2 %G — AB (ref 37–80)
HCT, POC: 43.8 % (ref 43.5–53.7)
Hemoglobin: 15.7 g/dL (ref 14.1–18.1)
Lymph, poc: 1.7 (ref 0.6–3.4)
MCH, POC: 31.2 pg (ref 27–31.2)
MCHC: 35.8 g/dL — AB (ref 31.8–35.4)
MCV: 87.2 fL (ref 80–97)
MID (cbc): 0.9 (ref 0–0.9)
MPV: 6.8 fL (ref 0–99.8)
POC Granulocyte: 10.5 — AB (ref 2–6.9)
POC LYMPH PERCENT: 13.3 %L (ref 10–50)
POC MID %: 6.5 %M (ref 0–12)
Platelet Count, POC: 359 10*3/uL (ref 142–424)
RBC: 5.03 M/uL (ref 4.69–6.13)
RDW, POC: 12.8 %
WBC: 13.1 10*3/uL — AB (ref 4.6–10.2)

## 2015-11-07 LAB — POCT URINALYSIS DIP (MANUAL ENTRY)
Bilirubin, UA: NEGATIVE
Glucose, UA: NEGATIVE
Leukocytes, UA: NEGATIVE
Nitrite, UA: NEGATIVE
Protein Ur, POC: 100 — AB
Spec Grav, UA: 1.015
Urobilinogen, UA: 0.2
pH, UA: 6

## 2015-11-07 LAB — COMPLETE METABOLIC PANEL WITH GFR
ALT: 17 U/L (ref 9–46)
AST: 21 U/L (ref 10–40)
Albumin: 4.8 g/dL (ref 3.6–5.1)
Alkaline Phosphatase: 66 U/L (ref 40–115)
BUN: 12 mg/dL (ref 7–25)
CO2: 26 mmol/L (ref 20–31)
Calcium: 9.8 mg/dL (ref 8.6–10.3)
Chloride: 102 mmol/L (ref 98–110)
Creat: 0.88 mg/dL (ref 0.60–1.35)
GFR, Est African American: >89 mL/min (ref >=60)
GFR, Est Non African American: >89 mL/min (ref >=60)
Glucose, Bld: 97 mg/dL (ref 65–99)
Potassium: 4.9 mmol/L (ref 3.5–5.3)
Sodium: 137 mmol/L (ref 135–146)
Total Bilirubin: 0.7 mg/dL (ref 0.2–1.2)
Total Protein: 7.4 g/dL (ref 6.1–8.1)

## 2015-11-07 LAB — LIPASE: Lipase: 15 U/L (ref 7–60)

## 2015-11-07 NOTE — Patient Instructions (Addendum)
  Please head over the [facility], [address], [place] [phone] Please go to the Radiology Department and check in for your Ultrasound Sound. Please refrain from eating anything on the way.   IF you received an x-ray today, you will receive an invoice from [facility]. Please contact [facility] at [phone] with questions or concerns regarding your invoice.   IF you received labwork today, you will receive an invoice from [facility]. Please contact [facility] at [phone] with questions or concerns regarding your invoice.   Our billing staff will not be able to assist you with questions regarding bills from these companies.  You will be contacted with the lab results as soon as they are available. The fastest way to get your results is to activate your My Chart account. Instructions are located on the last page of this paperwork. If you have not heard from us regarding the results in 2 weeks, please contact this office.

## 2015-11-07 NOTE — Telephone Encounter (Signed)
Called pt unable to reach him by phone, per Dr. Milus GlazierLauenstein no gallstones seen. He will be referring him to Urologist.

## 2015-11-07 NOTE — Progress Notes (Signed)
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  The pain has been intermittent since January.  The pain is associated with nausea and vomiting.  No clear relationship to meals.  F/Hx:  Positive for gallbladder disease.  Objective:  NAD BP 134/76 mmHg  Pulse 76  Temp(Src) 98.2 F (36.8 C)  Resp 18  Ht 5' 11.5" (1.816 m)  Wt 290 lb (131.543 kg)  BMI 39.89 kg/m2  SpO2 99% HEENT: unremarkable wth no icterus Chest: clear Heart:  Reg, no murmur Abdomen:  Soft, tender RUQ, no guarding or rebound.  Results for orders placed or performed in visit on 11/07/15  POCT Microscopic Urinalysis ([facility])  Result Value Ref Range   WBC,UR,HPF,POC None None WBC/hpf   RBC,UR,HPF,POC Many (A) None RBC/hpf   Bacteria None None, Too numerous to count   Mucus Absent Absent   Epithelial Cells, UR Per Microscopy None None, Too numerous to count cells/hpf  POCT urinalysis dipstick  Result Value Ref Range   Color, UA yellow yellow   Clarity, UA clear clear   Glucose, UA negative negative   Bilirubin, UA negative negative   Ketones, POC UA small (15) (A) negative   Spec Grav, UA 1.015    Blood, UA large (A) negative   pH, UA 6.0    Protein Ur, POC =100 (A) negative   Urobilinogen, UA 0.2    Nitrite, UA Negative Negative   Leukocytes, UA Negative Negative  POCT CBC  Result Value Ref Range   WBC 13.1 (A) 4.6 - 10.2 K/uL   Lymph, poc 1.7 0.6 - 3.4   POC LYMPH PERCENT 13.3 10 - 50 %L   MID (cbc) 0.9 0 - 0.9   POC MID % 6.5 0 - 12 %M   POC Granulocyte 10.5 (A) 2 - 6.9   Granulocyte percent 80.2 (A) 37 - 80 %G   RBC 5.03 4.69 - 6.13 M/uL   Hemoglobin 15.7 14.1 - 18.1 g/dL   HCT, POC 40.943.8 81.143.5 - 53.7 %   MCV 87.2 80 - 97 fL   MCH, POC 31.2 27 - 31.2 pg   MCHC 35.8 (A) 31.8 - 35.4 g/dL   RDW, POC 91.412.8 %   Platelet Count, POC 359 142 - 424 K/uL   MPV 6.8 0 - 99.8 fL     RUQ abdominal pain - Plan: POCT CBC, COMPLETE METABOLIC PANEL WITH GFR, Lipase, US Abdomen Limited RUQ,  CANCELED: US Abdomen Limited RUQ  Flank pain - Plan: POCT Microscopic Urinalysis ([facility]), POCT urinalysis dipstick, US Abdomen Limited RUQ, CANCELED: US Abdomen Limited RUQ  Hematuria  The combination of elevated white count and blood in the urine is suspicious for both gallbladder and kidney stone problem. We're getting the ultrasound this afternoon so we will be able to direct the patient in the proper direction with the results of that test.  Elvina SidleKurt Brittnei Jagiello, MD

## 2015-11-18 ENCOUNTER — Encounter: Payer: Self-pay | Admitting: *Deleted

## 2016-05-06 ENCOUNTER — Ambulatory Visit: Payer: BLUE CROSS/BLUE SHIELD | Admitting: Psychology

## 2017-07-13 DIAGNOSIS — R7989 Other specified abnormal findings of blood chemistry: Secondary | ICD-10-CM

## 2017-07-13 DIAGNOSIS — Z87442 Personal history of urinary calculi: Secondary | ICD-10-CM

## 2017-07-13 HISTORY — DX: Other specified abnormal findings of blood chemistry: R79.89

## 2017-07-13 HISTORY — DX: Personal history of urinary calculi: Z87.442

## 2017-11-21 ENCOUNTER — Emergency Department (HOSPITAL_BASED_OUTPATIENT_CLINIC_OR_DEPARTMENT_OTHER): Payer: BLUE CROSS/BLUE SHIELD

## 2017-11-21 ENCOUNTER — Encounter (HOSPITAL_BASED_OUTPATIENT_CLINIC_OR_DEPARTMENT_OTHER): Payer: Self-pay | Admitting: *Deleted

## 2017-11-21 ENCOUNTER — Other Ambulatory Visit: Payer: Self-pay

## 2017-11-21 ENCOUNTER — Emergency Department (HOSPITAL_BASED_OUTPATIENT_CLINIC_OR_DEPARTMENT_OTHER)
Admission: EM | Admit: 2017-11-21 | Discharge: 2017-11-22 | Disposition: A | Discharge status: Home / Self Care | Payer: BLUE CROSS/BLUE SHIELD | Attending: Emergency Medicine | Admitting: Emergency Medicine

## 2017-11-21 DIAGNOSIS — Z87891 Personal history of nicotine dependence: Secondary | ICD-10-CM | POA: Insufficient documentation

## 2017-11-21 DIAGNOSIS — R1084 Generalized abdominal pain: Secondary | ICD-10-CM | POA: Diagnosis present

## 2017-11-21 DIAGNOSIS — N200 Calculus of kidney: Secondary | ICD-10-CM | POA: Insufficient documentation

## 2017-11-21 LAB — URINALYSIS, ROUTINE W REFLEX MICROSCOPIC
Bilirubin Urine: NEGATIVE
GLUCOSE, UA: NEGATIVE mg/dL
Ketones, ur: NEGATIVE mg/dL
LEUKOCYTES UA: NEGATIVE
Nitrite: NEGATIVE
PH: 6 (ref 5.0–8.0)
Protein, ur: NEGATIVE mg/dL

## 2017-11-21 LAB — BASIC METABOLIC PANEL
ANION GAP: 8 (ref 5–15)
BUN: 16 mg/dL (ref 6–20)
CHLORIDE: 106 mmol/L (ref 101–111)
CO2: 25 mmol/L (ref 22–32)
Calcium: 9.2 mg/dL (ref 8.9–10.3)
Creatinine, Ser: 0.77 mg/dL (ref 0.61–1.24)
GFR calc non Af Amer: >60 mL/min (ref >60)
GLUCOSE: 95 mg/dL (ref 65–99)
Potassium: 3.6 mmol/L (ref 3.5–5.1)
Sodium: 139 mmol/L (ref 135–145)

## 2017-11-21 LAB — CBC WITH DIFFERENTIAL/PLATELET
Basophils Absolute: 0 10*3/uL (ref 0.0–0.1)
Basophils Relative: 0 %
Eosinophils Absolute: 0.2 10*3/uL (ref 0.0–0.7)
Eosinophils Relative: 2 %
HEMATOCRIT: 41.2 % (ref 39.0–52.0)
HEMOGLOBIN: 15.2 g/dL (ref 13.0–17.0)
LYMPHS ABS: 3.5 10*3/uL (ref 0.7–4.0)
LYMPHS PCT: 33 %
MCH: 31.7 pg (ref 26.0–34.0)
MCHC: 36.9 g/dL — AB (ref 30.0–36.0)
MCV: 86 fL (ref 78.0–100.0)
MONO ABS: 1.2 10*3/uL — AB (ref 0.1–1.0)
Monocytes Relative: 11 %
NEUTROS ABS: 5.8 10*3/uL (ref 1.7–7.7)
NEUTROS PCT: 54 %
Platelets: 360 10*3/uL (ref 150–400)
RBC: 4.79 MIL/uL (ref 4.22–5.81)
RDW: 12.5 % (ref 11.5–15.5)
WBC: 10.7 10*3/uL — ABNORMAL HIGH (ref 4.0–10.5)

## 2017-11-21 LAB — URINALYSIS, MICROSCOPIC (REFLEX): Bacteria, UA: NONE SEEN

## 2017-11-21 NOTE — ED Triage Notes (Signed)
Pt reports right side pain x 1 hour. Thinks he may have a kidney stone. Reports difficulty urinating

## 2017-11-21 NOTE — ED Provider Notes (Signed)
[facility] EMERGENCY DEPARTMENT Provider Note   CSN: [id number] Arrival date & time: 11/21/17  2026     History   Chief Complaint Chief Complaint  Patient presents with  . Flank Pain    HPI Peter Owens. is a 32 y.o. male.  HPI Patient with right flank pain.  History of kidney stones.  States around 1 hour ago began to have severe pain.  Dysuria.  No fevers.  Slight nausea.  States he does not want any for pain.  Has had previous kidney stones including a large proximal stone.  No fevers or chills.  Pain is not worse with movement. Past Medical History:  Diagnosis Date  . Allergy   . Anxiety   . Arthritis   . GERD (gastroesophageal reflux disease)   . Kidney stone     Patient Active Problem List   Diagnosis Date Noted  . Severe obesity (BMI >= 40) (HCC) 08/28/2013  . GERD (gastroesophageal reflux disease) 09/22/2011  . Anxiety 09/10/2011    Past Surgical History:  Procedure Laterality Date  . TYMPANOSTOMY TUBE PLACEMENT          Home Medications    Prior to Admission medications   Not on File    Family History Family History  Problem Relation Age of Onset  . Diabetes Mother   . Hypertension Mother   . Heart disease Mother   . Arthritis Mother   . Thyroid disease Mother   . Hyperlipidemia Mother   . Mental illness Mother   . Hypertension Father   . Thyroid disease Father   . Heart disease Father   . Hyperlipidemia Father   . Mental illness Father   . Hypertension Sister   . Mental illness Sister   . Hyperlipidemia Maternal Grandmother   . Hypertension Maternal Grandmother   . Heart disease Maternal Grandmother   . Cancer Maternal Grandfather   . Diabetes Maternal Grandfather   . Hyperlipidemia Maternal Grandfather   . Hypertension Maternal Grandfather   . Heart disease Paternal Grandmother   . Hyperlipidemia Paternal Grandmother   . Hypertension Paternal Grandmother   . Hyperlipidemia Paternal Grandfather   . Hypertension  Paternal Grandfather   . Heart disease Paternal Grandfather     Social History Social History   Tobacco Use  . Smoking status: Former Games developer  . Smokeless tobacco: Former Neurosurgeon    Quit date: 08/15/2011  Substance Use Topics  . Alcohol use: No  . Drug use: No     Allergies   Amoxicillin; Cephalosporins; and Penicillins   Review of Systems Review of Systems  Constitutional: Negative for appetite change.  HENT: Negative for congestion.   Respiratory: Negative for shortness of breath.   Cardiovascular: Negative for chest pain.  Gastrointestinal: Negative for abdominal pain.  Genitourinary: Positive for dysuria and flank pain.     Physical Exam Updated Vital Signs BP 124/71 (BP Location: Right Arm)   Pulse 71   Temp 99.1 F (37.3 C) (Oral)   Resp 18   Ht  (1.803 m)   Wt 125.2 kg (276 lb)   SpO2 99%   BMI 38.49 kg/m   Physical Exam  Constitutional: He appears well-developed.  HENT:  Head: Normocephalic.  Eyes: Pupils are equal, round, and reactive to light.  Neck: Neck supple.  Cardiovascular: Normal rate.  Pulmonary/Chest: Effort normal.  Abdominal: There is tenderness.  Genitourinary:  Genitourinary Comments: Right CVA tenderness.  Musculoskeletal: Normal range of motion.  Neurological: He is  alert.  Skin: Skin is warm. Capillary refill takes less than 2 seconds.     ED Treatments / Results  Labs (all labs ordered are listed, but only abnormal results are displayed) Labs Reviewed  URINALYSIS, ROUTINE W REFLEX MICROSCOPIC - Abnormal; Notable for the following components:      Result Value   Specific Gravity, Urine >1.030 (*)    Hgb urine dipstick MODERATE (*)    All other components within normal limits  CBC WITH DIFFERENTIAL/PLATELET - Abnormal; Notable for the following components:   WBC 10.7 (*)    MCHC 36.9 (*)    Monocytes Absolute 1.2 (*)    All other components within normal limits  BASIC METABOLIC PANEL  URINALYSIS, MICROSCOPIC  (REFLEX)    EKG None  Radiology Dg Abdomen 1 View  Result Date: 11/21/2017 CLINICAL DATA:  Acute RIGHT flank pain for 3 hours. EXAM: ABDOMEN - 1 VIEW COMPARISON:  None. FINDINGS: A 16 mm calcification overlying the MEDIAL aspect of the RIGHT renal shadow is noted and may represent a renal calculus or lie within the renal pelvis/UPJ. A 4 mm calcification within the RIGHT anatomic pelvis is indeterminate. The bowel gas pattern is unremarkable. No other significant abnormalities are present. IMPRESSION: 16 mm calcification overlying the MEDIAL aspect of the RIGHT renal shadow which may lie within the kidney or within the RIGHT renal pelvis/UPJ. Indeterminate 4 mm calcification within the RIGHT anatomic pelvis which could represent a phlebolith. Consider CT for further evaluation. Electronically Signed   By: Harmon Pier M.D.   On: 11/21/2017 21:52   Ct Renal Stone Study  Result Date: 11/21/2017 CLINICAL DATA:  RIGHT flank pain for 1 hour, dysuria. Hematuria for 4 days. History of kidney stones. EXAM: CT ABDOMEN AND PELVIS WITHOUT CONTRAST TECHNIQUE: Multidetector CT imaging of the abdomen and pelvis was performed following the standard protocol without IV contrast. COMPARISON:  Abdominal radiograph Nov 21, 2017 FINDINGS: LOWER CHEST: Lung bases are clear. The visualized heart size is normal. No pericardial effusion. HEPATOBILIARY: Normal. PANCREAS: Normal. SPLEEN: Normal. ADRENALS/URINARY TRACT: Kidneys are orthotopic, demonstrating normal size and morphology. 10 x 12 mm calculus RIGHT ureteropelvic junction with moderate RIGHT hydronephrosis. Limited for renal masses on this nonenhanced examination. The unopacified ureters are normal in course and caliber. Urinary bladder is partially distended and unremarkable. Normal adrenal glands. STOMACH/BOWEL: The stomach, small and large bowel are normal in course and caliber without inflammatory changes, sensitivity decreased by lack of enteric contrast. Normal  appendix. VASCULAR/LYMPHATIC: Aortoiliac vessels are normal in course and caliber. No lymphadenopathy by CT size criteria. REPRODUCTIVE: Normal. OTHER: No intraperitoneal free fluid or free air. MUSCULOSKELETAL: Non-acute. Ankylosis of the sacroiliac joints. Severe L5-S1 disc height loss with vacuum disc and endplate spurring compatible with degenerative disc resulting in moderate neural foraminal narrowing. IMPRESSION: 1. 10 x 12 mm RIGHT UPJ calculus resulting in moderate RIGHT hydronephrosis. Electronically Signed   By: Awilda Metro M.D.   On: 11/21/2017 23:56    Procedures Procedures (including critical care time)  Medications Ordered in ED Medications - No data to display   Initial Impression / Assessment and Plan / ED Course  I have reviewed the triage vital signs and the nursing notes.  Pertinent labs & imaging results that were available during my care of the patient were reviewed by me and considered in my medical decision making (see chart for details).     Patient with right flank pain.  Has had some upper pain but pain that was bothering  him more was a lower abdomen.  Does have a right UPJ stone.  I think this is likely not the groin pain but could be the flank pain cause.  No infection.  There is some disc abnormality at L5-S1 that could be causing the lower pain.  Does not want narcotic pain medicine.  Will discharge home.  Final Clinical Impressions(s) / ED Diagnoses   Final diagnoses:  Kidney stone    ED Discharge Orders    None       Benjiman Core, MD 11/22/17 0006

## 2017-11-22 NOTE — Discharge Instructions (Addendum)
Follow-up with urology for the large kidney stone.  Follow-up with your doctor for the back pain.

## 2017-12-03 ENCOUNTER — Encounter: Payer: Self-pay | Admitting: Physician Assistant

## 2017-12-03 ENCOUNTER — Ambulatory Visit (INDEPENDENT_AMBULATORY_CARE_PROVIDER_SITE_OTHER): Payer: BLUE CROSS/BLUE SHIELD | Admitting: Physician Assistant

## 2017-12-03 ENCOUNTER — Other Ambulatory Visit: Payer: Self-pay

## 2017-12-03 ENCOUNTER — Other Ambulatory Visit: Payer: Self-pay | Admitting: Physician Assistant

## 2017-12-03 VITALS — BP 120/76 | HR 76 | Temp 98.1°F | Resp 15 | Ht 71.0 in | Wt 301.8 lb

## 2017-12-03 DIAGNOSIS — Z0000 Encounter for general adult medical examination without abnormal findings: Secondary | ICD-10-CM

## 2017-12-03 DIAGNOSIS — E291 Testicular hypofunction: Secondary | ICD-10-CM

## 2017-12-03 LAB — COMPREHENSIVE METABOLIC PANEL
ALBUMIN: 4.5 g/dL (ref 3.5–5.2)
ALK PHOS: 59 U/L (ref 39–117)
ALT: 20 U/L (ref 0–53)
AST: 18 U/L (ref 0–37)
BUN: 12 mg/dL (ref 6–23)
CALCIUM: 9.6 mg/dL (ref 8.4–10.5)
CO2: 27 mEq/L (ref 19–32)
CREATININE: 0.84 mg/dL (ref 0.40–1.50)
Chloride: 104 mEq/L (ref 96–112)
GFR: 112.17 mL/min (ref >60.00)
Glucose, Bld: 103 mg/dL — ABNORMAL HIGH (ref 70–99)
Potassium: 4.8 mEq/L (ref 3.5–5.1)
Sodium: 140 mEq/L (ref 135–145)
TOTAL PROTEIN: 7 g/dL (ref 6.0–8.3)
Total Bilirubin: 0.5 mg/dL (ref 0.2–1.2)

## 2017-12-03 LAB — TESTOSTERONE: TESTOSTERONE: 162.7 ng/dL — AB (ref 300.00–890.00)

## 2017-12-03 LAB — CBC WITH DIFFERENTIAL/PLATELET
Basophils Absolute: 0 10*3/uL (ref 0.0–0.1)
Basophils Relative: 0.6 % (ref 0.0–3.0)
EOS ABS: 0.1 10*3/uL (ref 0.0–0.7)
EOS PCT: 1 % (ref 0.0–5.0)
HEMATOCRIT: 43.5 % (ref 39.0–52.0)
HEMOGLOBIN: 15.5 g/dL (ref 13.0–17.0)
LYMPHS PCT: 31.4 % (ref 12.0–46.0)
Lymphs Abs: 2.3 10*3/uL (ref 0.7–4.0)
MCHC: 35.7 g/dL (ref 30.0–36.0)
MCV: 87.7 fl (ref 78.0–100.0)
MONO ABS: 0.6 10*3/uL (ref 0.1–1.0)
Monocytes Relative: 8.1 % (ref 3.0–12.0)
Neutro Abs: 4.3 10*3/uL (ref 1.4–7.7)
Neutrophils Relative %: 58.9 % (ref 43.0–77.0)
Platelets: 395 10*3/uL (ref 150.0–400.0)
RBC: 4.97 Mil/uL (ref 4.22–5.81)
RDW: 12.8 % (ref 11.5–15.5)
WBC: 7.3 10*3/uL (ref 4.0–10.5)

## 2017-12-03 LAB — TSH: TSH: 1.14 u[IU]/mL (ref 0.35–4.50)

## 2017-12-03 LAB — HEMOGLOBIN A1C: HEMOGLOBIN A1C: 5.1 % (ref 4.6–6.5)

## 2017-12-03 LAB — LIPID PANEL
Cholesterol: 190 mg/dL (ref 0–200)
HDL: 35.4 mg/dL — ABNORMAL LOW (ref >39.00)
LDL Cholesterol: 128 mg/dL — ABNORMAL HIGH (ref 0–99)
NONHDL: 154.42
Total CHOL/HDL Ratio: 5
Triglycerides: 132 mg/dL (ref 0.0–149.0)
VLDL: 26.4 mg/dL (ref 0.0–40.0)

## 2017-12-03 NOTE — Patient Instructions (Signed)
Please go to the lab for blood work.   Our office will call you with your results unless you have chosen to receive results via MyChart.  If your blood work is normal we will follow-up each year for physicals and as scheduled for chronic medical problems.  If anything is abnormal we will treat accordingly and get you in for a follow-up.  Welcome to [facility]! It was nice to see you again!   Preventive Care 18-39 Years, Male Preventive care refers to lifestyle choices and visits with your health care provider that can promote health and wellness. What does preventive care include?  A yearly physical exam. This is also called an annual well check.  Dental exams once or twice a year.  Routine eye exams. Ask your health care provider how often you should have your eyes checked.  Personal lifestyle choices, including: ? Daily care of your teeth and gums. ? Regular physical activity. ? Eating a healthy diet. ? Avoiding tobacco and drug use. ? Limiting alcohol use. ? Practicing safe sex. What happens during an annual well check? The services and screenings done by your health care provider during your annual well check will depend on your age, overall health, lifestyle risk factors, and family history of disease. Counseling Your health care provider may ask you questions about your:  Alcohol use.  Tobacco use.  Drug use.  Emotional well-being.  Home and relationship well-being.  Sexual activity.  Eating habits.  Work and work Statistician.  Screening You may have the following tests or measurements:  Height, weight, and BMI.  Blood pressure.  Lipid and cholesterol levels. These may be checked every 5 years starting at age 71.  Diabetes screening. This is done by checking your blood sugar (glucose) after you have not eaten for a while (fasting).  Skin check.  Hepatitis C blood test.  Hepatitis B blood test.  Sexually transmitted disease (STD)  testing.  Discuss your test results, treatment options, and if necessary, the need for more tests with your health care provider. Vaccines Your health care provider may recommend certain vaccines, such as:  Influenza vaccine. This is recommended every year.  Tetanus, diphtheria, and acellular pertussis (Tdap, Td) vaccine. You may need a Td booster every 10 years.  Varicella vaccine. You may need this if you have not been vaccinated.  HPV vaccine. If you are 35 or younger, you may need three doses over 6 months.  Measles, mumps, and rubella (MMR) vaccine. You may need at least one dose of MMR.You may also need a second dose.  Pneumococcal 13-valent conjugate (PCV13) vaccine. You may need this if you have certain conditions and have not been vaccinated.  Pneumococcal polysaccharide (PPSV23) vaccine. You may need one or two doses if you smoke cigarettes or if you have certain conditions.  Meningococcal vaccine. One dose is recommended if you are age 64-21 years and a first-year college student living in a residence hall, or if you have one of several medical conditions. You may also need additional booster doses.  Hepatitis A vaccine. You may need this if you have certain conditions or if you travel or work in places where you may be exposed to hepatitis A.  Hepatitis B vaccine. You may need this if you have certain conditions or if you travel or work in places where you may be exposed to hepatitis B.  Haemophilus influenzae type b (Hib) vaccine. You may need this if you have certain risk factors.  Talk to your  health care provider about which screenings and vaccines you need and how often you need them. This information is not intended to replace advice given to you by your health care provider. Make sure you discuss any questions you have with your health care provider. Document Released: 08/25/2001 Document Revised: 03/18/2016 Document Reviewed: 04/30/2015 Elsevier Interactive Patient  Education  [employer].

## 2017-12-03 NOTE — Assessment & Plan Note (Signed)
Depression screen negative. Health Maintenance reviewed -- up-to-date. Preventive schedule discussed and handout given in AVS. Will obtain fasting labs today.  

## 2017-12-03 NOTE — Assessment & Plan Note (Signed)
Discussed dietary and exercise recommendations. Will check TSH and Testosterone today.

## 2017-12-03 NOTE — Addendum Note (Signed)
Addended by: Waldon Merl on: 12/03/2017 08:33 AM   Modules accepted: Level of Service

## 2017-12-03 NOTE — Progress Notes (Signed)
Patient presents to clinic today to establish care. Denies acute concerns today. Is followed by [facility] (Dr. Edwena Blow) for nephrolithiasis. Has appt today for follow-up to discuss removal of a 10 x 12 mm stone. He is asymptomatic presently.   Health Maintenance: Immunizations -- up-to-date.  Past Medical History:  Diagnosis Date  . Allergy   . Anxiety   . Arthritis   . GERD (gastroesophageal reflux disease)   . Kidney stone     Past Surgical History:  Procedure Laterality Date  . TYMPANOSTOMY TUBE PLACEMENT      Current Outpatient Medications on File Prior to Visit  Medication Sig Dispense Refill  . cetirizine (ZYRTEC) 10 MG tablet Take 10 mg by mouth as needed for allergies.     No current facility-administered medications on file prior to visit.     Allergies  Allergen Reactions  . Amoxicillin   . Cephalosporins Hives  . Penicillins Hives    childhood    Family History  Problem Relation Age of Onset  . Diabetes Mother   . Hypertension Mother   . Heart disease Mother   . Arthritis Mother   . Thyroid disease Mother   . Hyperlipidemia Mother   . Mental illness Mother   . Hypertension Father   . Thyroid disease Father   . Heart disease Father   . Hyperlipidemia Father   . Mental illness Father   . Hypertension Sister   . Mental illness Sister   . Hyperlipidemia Maternal Grandmother   . Hypertension Maternal Grandmother   . Heart disease Maternal Grandmother   . Cancer Maternal Grandfather   . Diabetes Maternal Grandfather   . Hyperlipidemia Maternal Grandfather   . Hypertension Maternal Grandfather   . Heart disease Paternal Grandmother   . Hyperlipidemia Paternal Grandmother   . Hypertension Paternal Grandmother   . Hyperlipidemia Paternal Grandfather   . Hypertension Paternal Grandfather   . Heart disease Paternal Grandfather     Social History   Socioeconomic History  . Marital status: Married    Spouse name: Not on file  . Number of  children: Not on file  . Years of education: Not on file  . Highest education level: Not on file  Occupational History  . Not on file  Social Needs  . Financial resource strain: Not on file  . Food insecurity:    Worry: Not on file    Inability: Not on file  . Transportation needs:    Medical: Not on file    Non-medical: Not on file  Tobacco Use  . Smoking status: Former Research scientist (life sciences)  . Smokeless tobacco: Former Systems developer    Quit date: 08/15/2011  Substance and Sexual Activity  . Alcohol use: No  . Drug use: No  . Sexual activity: Not on file  Lifestyle  . Physical activity:    Days per week: Not on file    Minutes per session: Not on file  . Stress: Not on file  Relationships  . Social connections:    Talks on phone: Not on file    Gets together: Not on file    Attends religious service: Not on file    Active member of club or organization: Not on file    Attends meetings of clubs or organizations: Not on file    Relationship status: Not on file  . Intimate partner violence:    Fear of current or ex partner: Not on file    Emotionally abused: Not on file  Physically abused: Not on file    Forced sexual activity: Not on file  Other Topics Concern  . Not on file  Social History Narrative  . Not on file   Review of Systems  Constitutional: Negative for fever and weight loss.  HENT: Negative for ear discharge, ear pain, hearing loss and tinnitus.   Eyes: Negative for blurred vision, double vision, photophobia and pain.  Respiratory: Negative for cough and shortness of breath.   Cardiovascular: Negative for chest pain and palpitations.  Gastrointestinal: Negative for abdominal pain, blood in stool, constipation, diarrhea, heartburn, melena, nausea and vomiting.  Genitourinary: Negative for dysuria, flank pain, frequency, hematuria and urgency.  Musculoskeletal: Negative for falls.  Neurological: Negative for dizziness, loss of consciousness and headaches.  Endo/Heme/Allergies:  Negative for environmental allergies.  Psychiatric/Behavioral: Negative for depression, hallucinations, substance abuse and suicidal ideas. The patient is not nervous/anxious and does not have insomnia.    BP 120/76   Pulse 76   Temp 98.1 F (36.7 C) (Oral)   Resp 15   Ht 5' 11" (1.803 m)   Wt (!) 301 lb 12.8 oz (136.9 kg)   SpO2 96%   BMI 42.09 kg/m   Physical Exam  Constitutional: He is oriented to person, place, and time. He appears well-developed and well-nourished. No distress.  HENT:  Head: Normocephalic and atraumatic.  Right Ear: Tympanic membrane, external ear and ear canal normal.  Left Ear: Tympanic membrane, external ear and ear canal normal.  Nose: Nose normal.  Mouth/Throat: Oropharynx is clear and moist and mucous membranes are normal. No posterior oropharyngeal edema or posterior oropharyngeal erythema.  Eyes: Pupils are equal, round, and reactive to light. Conjunctivae are normal.  Neck: Neck supple. No thyromegaly present.  Cardiovascular: Normal rate, regular rhythm, normal heart sounds and intact distal pulses.  Pulmonary/Chest: Effort normal and breath sounds normal. No respiratory distress. He has no wheezes. He has no rales. He exhibits no tenderness.  Abdominal: Soft. Bowel sounds are normal. He exhibits no distension and no mass. There is no tenderness. There is no rebound and no guarding.  Lymphadenopathy:    He has no cervical adenopathy.  Neurological: He is alert and oriented to person, place, and time. No cranial nerve deficit.  Skin: Skin is warm and dry. No rash noted. He is not diaphoretic.  Psychiatric: He has a normal mood and affect.  Vitals reviewed.   Recent Results (from the past 2160 hour(s))  Urinalysis, Routine w reflex microscopic- may I&O cath if menses     Status: Abnormal   Collection Time: 11/21/17  8:51 PM  Result Value Ref Range   Color, Urine YELLOW YELLOW   APPearance CLEAR CLEAR   Specific Gravity, Urine >1.030 (H) 1.005 -  1.030   pH 6.0 5.0 - 8.0   Glucose, UA NEGATIVE NEGATIVE mg/dL   Hgb urine dipstick MODERATE (A) NEGATIVE   Bilirubin Urine NEGATIVE NEGATIVE   Ketones, ur NEGATIVE NEGATIVE mg/dL   Protein, ur NEGATIVE NEGATIVE mg/dL   Nitrite NEGATIVE NEGATIVE   Leukocytes, UA NEGATIVE NEGATIVE    Comment: Performed at [facility], [address] [place], [place] [address]  Urinalysis, Microscopic (reflex)     Status: None   Collection Time: 11/21/17  8:51 PM  Result Value Ref Range   RBC / HPF 11-20 0 - 5 RBC/hpf   WBC, UA 0-5 0 - 5 WBC/hpf   Bacteria, UA NONE SEEN NONE SEEN   Squamous Epithelial / LPF 0-5  0 - 5   Mucus PRESENT     Comment: Performed at [facility], [address] [place], [place] [address]  Basic metabolic panel     Status: None   Collection Time: 11/21/17  8:55 PM  Result Value Ref Range   Sodium 139 135 - 145 mmol/L   Potassium 3.6 3.5 - 5.1 mmol/L   Chloride 106 101 - 111 mmol/L   CO2 25 22 - 32 mmol/L   Glucose, Bld 95 65 - 99 mg/dL   BUN 16 6 - 20 mg/dL   Creatinine, Ser 0.77 0.61 - 1.24 mg/dL   Calcium 9.2 8.9 - 10.3 mg/dL   GFR calc non Af Amer >60 >60 mL/min   GFR calc Af Amer >60 >60 mL/min    Comment: (NOTE) The eGFR has been calculated using the CKD EPI equation. This calculation has not been validated in all clinical situations. eGFR's persistently <60 mL/min signify possible Chronic Kidney Disease.    Anion gap 8 5 - 15    Comment: Performed at [facility], [address] [place], [place] [address]  CBC with Differential     Status: Abnormal   Collection Time: 11/21/17  8:55 PM  Result Value Ref Range   WBC 10.7 (H) 4.0 - 10.5 K/uL   RBC 4.79 4.22 - 5.81 MIL/uL   Hemoglobin 15.2 13.0 - 17.0 g/dL   HCT 41.2 39.0 - 52.0 %   MCV 86.0 78.0 - 100.0 fL   MCH 31.7 26.0 - 34.0 pg   MCHC 36.9 (H) 30.0 - 36.0 g/dL   RDW 12.5 11.5 - 15.5 %   Platelets 360 150 - 400 K/uL   Neutrophils Relative % 54 %   Neutro Abs 5.8 1.7  - 7.7 K/uL   Lymphocytes Relative 33 %   Lymphs Abs 3.5 0.7 - 4.0 K/uL   Monocytes Relative 11 %   Monocytes Absolute 1.2 (H) 0.1 - 1.0 K/uL   Eosinophils Relative 2 %   Eosinophils Absolute 0.2 0.0 - 0.7 K/uL   Basophils Relative 0 %   Basophils Absolute 0.0 0.0 - 0.1 K/uL    Comment: Performed at [facility], [address] [place], [place] [address]   Assessment/Plan: Visit for preventive health examination Depression screen negative. Health Maintenance reviewed -- up-to-date. Preventive schedule discussed and handout given in AVS. Will obtain fasting labs today.   Severe obesity (BMI >= 40) Discussed dietary and exercise recommendations. Will check TSH and Testosterone today.    Leeanne Rio, PA-C

## 2017-12-14 ENCOUNTER — Other Ambulatory Visit: Payer: Self-pay | Admitting: Urology

## 2017-12-15 ENCOUNTER — Encounter (HOSPITAL_BASED_OUTPATIENT_CLINIC_OR_DEPARTMENT_OTHER): Payer: Self-pay | Admitting: *Deleted

## 2017-12-17 ENCOUNTER — Other Ambulatory Visit (INDEPENDENT_AMBULATORY_CARE_PROVIDER_SITE_OTHER): Payer: BLUE CROSS/BLUE SHIELD

## 2017-12-17 DIAGNOSIS — E291 Testicular hypofunction: Secondary | ICD-10-CM

## 2017-12-17 LAB — TESTOSTERONE: Testosterone: 190.85 ng/dL — ABNORMAL LOW (ref 300.00–890.00)

## 2017-12-17 LAB — PSA: PSA: 1.64 ng/mL (ref 0.10–4.00)

## 2018-02-09 ENCOUNTER — Encounter (HOSPITAL_BASED_OUTPATIENT_CLINIC_OR_DEPARTMENT_OTHER): Payer: Self-pay | Admitting: *Deleted

## 2018-02-09 ENCOUNTER — Other Ambulatory Visit: Payer: Self-pay

## 2018-02-09 DIAGNOSIS — L237 Allergic contact dermatitis due to plants, except food: Secondary | ICD-10-CM

## 2018-02-09 HISTORY — DX: Allergic contact dermatitis due to plants, except food: L23.7

## 2018-02-09 NOTE — Progress Notes (Signed)
Spoke with patient via telephone for pre op interview. Pt to arrive at 0530. NPO after MN. Can take zyrtec in AM with sip of water. Per MD order patient to have KUB morning of surgery.

## 2018-02-17 NOTE — H&P (Signed)
HPI: Peter Owens is a 32 year-old male with a right UPJ stone  The problem is on the right side. He first stated noticing pain on approximately 04/14/2017. This is not his first kidney stone. He has had 2 stones prior to getting this one. He is currently having flank pain and back pain. He denies having groin pain, nausea, vomiting, fever, and chills. He has not caught a stone in his urine strainer since his symptoms began.   He has never had surgical treatment for calculi in the past.   He has a known 11mm stone in the right lower pole and has had previous stone events. He has never seen a stone pass or had procedural intervention.   04/15/18: Patient awoke with right lower back pain radiating around to the right flank and lower abdomen. He describes pain as sharp/stabbing. It is since been resolving after taking ibuprofen. Also associated with initial nausea and vomiting as well as some urinary urgency and hesitancy to void. Denies voiding complaints now. He is afebrile.   12/03/17: He has returned today he has continued to have some intermittent right flank pain. He said he was supposed to be contacted with options for management but that did not occur so he came in today to discuss further treatment.     ALLERGIES: Amoxicillin TABS Penicillins    MEDICATIONS: Cranberry  Hydrocodone-Acetaminophen 5 mg-325 mg tablet 1-2 tablet PO Q 6 H PRN  Ondansetron Odt 4 mg tablet,disintegrating 1 tablet PO Q 6 H  Ranitidine Hcl     GU PSH: None     PSH Notes: No Surgical Problems   NON-GU PSH: None   GU PMH: Gross hematuria, Gross hematuria - 2017 Renal calculus, Kidney stone on right side - 2017      PMH Notes: Calculus disease: He experienced left flank pain and was evaluated with a renal ultrasound in 9/91 revealing some mild fullness on that side. An ultrasound done in 3/93 for pain on the left side again revealed similar findings and this was evaluated further with an IVP in 3/95 which  revealed no abnormality. In 9/93 and IVP revealed what appeared to be a punctate stone located at the ureterovesical junction that eventually passed.   CT scan 03/02/13:3 mm upper pole right renal calculus with known left renal calculi. At that time he had a 2 mm stone in the right ureter which he passed.      NON-GU PMH: Encounter for general adult medical examination without abnormal findings, Encounter for preventive health examination - 2017 Anxiety, Anxiety (Symptom) - 2014 Personal history of other diseases of the digestive system, History of esophageal reflux - 2014    FAMILY HISTORY: Congestive Heart Failure - Runs In Family Diabetes - Runs In Family Family Health Status Number - Runs In Family Hematuria - Mother Hypertension - Runs In Family nephrolithiasis - Mother Thyroid Disorder - Runs In Family   SOCIAL HISTORY: None    Notes: Never a smoker, Tobacco use, Caffeine Use, Occupation:, Marital History - Currently Married, Alcohol Use   REVIEW OF SYSTEMS:    GU Review Male:   Patient denies get up at night to urinate, stream starts and stops, hard to postpone urination, trouble starting your stream, have to strain to urinate , leakage of urine, erection problems, penile pain, burning/ pain with urination, and frequent urination.  Gastrointestinal (Upper):   Patient denies nausea, vomiting, and indigestion/ heartburn.  Gastrointestinal (Lower):   Patient denies diarrhea and constipation.  Constitutional:  Patient denies fever, night sweats, weight loss, and fatigue.  Skin:   Patient denies skin rash/ lesion and itching.  Eyes:   Patient denies blurred vision and double vision.  Ears/ Nose/ Throat:   Patient denies sore throat and sinus problems.  Hematologic/Lymphatic:   Patient denies swollen glands and easy bruising.  Cardiovascular:   Patient denies leg swelling and chest pains.  Respiratory:   Patient denies cough and shortness of breath.  Endocrine:   Patient denies  excessive thirst.  Musculoskeletal:   Patient denies back pain and joint pain.  Neurological:   Patient denies headaches and dizziness.  Psychologic:   Patient denies depression and anxiety.   VITAL SIGNS:   Weight 304 lb / 137.89 kg  Height 71 in / 180.34 cm  BP 120/75 mmHg  Pulse 78 /min  Temperature 97.3 F / 36.2 C  BMI 42.4 kg/m   Physical Exam  Constitutional: Well nourished and well developed . No acute distress.   ENT:. The ears and nose are normal in appearance.   Neck: The appearance of the neck is normal and no neck mass is present.   Pulmonary: No respiratory distress and normal respiratory rhythm and effort.   Cardiovascular: Heart rate and rhythm are normal . No peripheral edema.   Abdomen: The abdomen is soft and nontender. No masses are palpated. No CVA tenderness. No hernias are palpable. No hepatosplenomegaly noted.   Lymphatics: The femoral and inguinal nodes are not enlarged or tender.   Skin: Normal skin turgor, no visible rash and no visible skin lesions.   Neuro/Psych:. Mood and affect are appropriate.    PAST DATA REVIEWED:  Source Of History:  Patient  X-Ray Review: KUB: Reviewed Films. KUB on 11/21/17 review of the stone was easily visible. C.T. Abdomen: Reviewed Films. Review his CT scan on 11/21/17 revealed a 12 x 12 mm stone located at the UPJ on the right hand side with no other right or left renal calculi. It had Hounsfield units of 1000.    PROCEDURES:          Urinalysis w/Scope Dipstick Dipstick Cont'd Micro  Color: Yellow Bilirubin: Neg WBC/hpf: NS (Not Seen)  Appearance: Clear Ketones: Neg RBC/hpf: 3 - 10/hpf  Specific Gravity: 1.020 Blood: Neg Bacteria: NS (Not Seen)  pH: 7.0 Protein: 1+ Cystals: NS (Not Seen)  Glucose: Neg Urobilinogen: 0.2 Casts: NS (Not Seen)    Nitrites: Neg Trichomonas: Not Present    Leukocyte Esterase: Neg Mucous: Not Present      Epithelial Cells: NS (Not Seen)      Yeast: NS (Not Seen)      Sperm: Not  Present   Yes ASSESSMENT/PLAN:      ICD-10 Details  1 GU:   Renal calculus - N20.0 Right, Stable - His stone is fairly sizable. It is not large enough that I recommended a PCNL. I am afraid lithotripsy might result in a Steinstrasse or incomplete clearance of his stone so he has elected to proceed with ureteroscopic management.  2   Flank Pain - R10.84 Right, Stable - He is experiencing intermittent flank pain. He also had some hydronephrosis on the right-hand side by CT scan

## 2018-02-17 NOTE — Anesthesia Preprocedure Evaluation (Addendum)
Anesthesia Evaluation  Patient identified by MRN, date of birth, ID band Patient awake    Reviewed: Allergy & Precautions, NPO status , Patient's Chart, lab work & pertinent test results  History of Anesthesia Complications Negative for: history of anesthetic complications  Airway Mallampati: III  TM Distance: >3 FB Neck ROM: Full    Dental no notable dental hx. (+) Dental Advisory Given   Pulmonary former smoker,    Pulmonary exam normal        Cardiovascular negative cardio ROS Normal cardiovascular exam     Neuro/Psych PSYCHIATRIC DISORDERS Anxiety negative neurological ROS     GI/Hepatic negative GI ROS, Neg liver ROS, GERD  ,  Endo/Other  Morbid obesity  Renal/GU      Musculoskeletal negative musculoskeletal ROS (+)   Abdominal   Peds  Hematology negative hematology ROS (+)   Anesthesia Other Findings Day of surgery medications reviewed with the patient.  Reproductive/Obstetrics                            Anesthesia Physical Anesthesia Plan  ASA: III  Anesthesia Plan: General   Post-op Pain Management:    Induction: Intravenous  PONV Risk Score and Plan: 3 and Ondansetron, Dexamethasone and Scopolamine patch - Pre-op  Airway Management Planned: LMA and Oral ETT  Additional Equipment:   Intra-op Plan:   Post-operative Plan: Extubation in OR  Informed Consent: I have reviewed the patients History and Physical, chart, labs and discussed the procedure including the risks, benefits and alternatives for the proposed anesthesia with the patient or authorized representative who has indicated his/her understanding and acceptance.   Dental advisory given  Plan Discussed with: CRNA and Anesthesiologist  Anesthesia Plan Comments:        Anesthesia Quick Evaluation

## 2018-02-18 ENCOUNTER — Encounter (HOSPITAL_BASED_OUTPATIENT_CLINIC_OR_DEPARTMENT_OTHER)
Admission: RE | Disposition: A | Discharge status: Home / Self Care | Payer: Self-pay | Source: Ambulatory Visit | Attending: Urology

## 2018-02-18 ENCOUNTER — Ambulatory Visit (HOSPITAL_BASED_OUTPATIENT_CLINIC_OR_DEPARTMENT_OTHER): Payer: BLUE CROSS/BLUE SHIELD | Admitting: Anesthesiology

## 2018-02-18 ENCOUNTER — Ambulatory Visit (HOSPITAL_BASED_OUTPATIENT_CLINIC_OR_DEPARTMENT_OTHER)
Admission: RE | Admit: 2018-02-18 | Discharge: 2018-02-18 | Disposition: A | Discharge status: Home / Self Care | Payer: BLUE CROSS/BLUE SHIELD | Source: Ambulatory Visit | Attending: Urology | Admitting: Urology

## 2018-02-18 ENCOUNTER — Ambulatory Visit (HOSPITAL_COMMUNITY): Payer: BLUE CROSS/BLUE SHIELD

## 2018-02-18 ENCOUNTER — Encounter (HOSPITAL_BASED_OUTPATIENT_CLINIC_OR_DEPARTMENT_OTHER): Payer: Self-pay

## 2018-02-18 DIAGNOSIS — N132 Hydronephrosis with renal and ureteral calculous obstruction: Secondary | ICD-10-CM | POA: Diagnosis not present

## 2018-02-18 DIAGNOSIS — F419 Anxiety disorder, unspecified: Secondary | ICD-10-CM | POA: Insufficient documentation

## 2018-02-18 DIAGNOSIS — Z79899 Other long term (current) drug therapy: Secondary | ICD-10-CM | POA: Diagnosis not present

## 2018-02-18 DIAGNOSIS — Z8349 Family history of other endocrine, nutritional and metabolic diseases: Secondary | ICD-10-CM | POA: Diagnosis not present

## 2018-02-18 DIAGNOSIS — Z833 Family history of diabetes mellitus: Secondary | ICD-10-CM | POA: Insufficient documentation

## 2018-02-18 DIAGNOSIS — Z841 Family history of disorders of kidney and ureter: Secondary | ICD-10-CM | POA: Insufficient documentation

## 2018-02-18 DIAGNOSIS — Z87442 Personal history of urinary calculi: Secondary | ICD-10-CM | POA: Diagnosis not present

## 2018-02-18 DIAGNOSIS — N201 Calculus of ureter: Secondary | ICD-10-CM

## 2018-02-18 DIAGNOSIS — Z8249 Family history of ischemic heart disease and other diseases of the circulatory system: Secondary | ICD-10-CM | POA: Diagnosis not present

## 2018-02-18 DIAGNOSIS — N200 Calculus of kidney: Secondary | ICD-10-CM | POA: Diagnosis present

## 2018-02-18 DIAGNOSIS — Z880 Allergy status to penicillin: Secondary | ICD-10-CM | POA: Diagnosis not present

## 2018-02-18 DIAGNOSIS — K219 Gastro-esophageal reflux disease without esophagitis: Secondary | ICD-10-CM | POA: Insufficient documentation

## 2018-02-18 HISTORY — DX: Allergic contact dermatitis due to plants, except food: L23.7

## 2018-02-18 HISTORY — DX: Personal history of urinary calculi: Z87.442

## 2018-02-18 HISTORY — PX: CYSTOSCOPY/URETEROSCOPY/HOLMIUM LASER/STENT PLACEMENT: SHX6546

## 2018-02-18 HISTORY — DX: Other specified abnormal findings of blood chemistry: R79.89

## 2018-02-18 HISTORY — DX: Calculus of ureter: N20.1

## 2018-02-18 HISTORY — DX: Generalized anxiety disorder: F41.1

## 2018-02-18 SURGERY — CYSTOSCOPY/URETEROSCOPY/HOLMIUM LASER/STENT PLACEMENT
Anesthesia: General | Site: Ureter | Laterality: Right

## 2018-02-18 MED ORDER — OXYCODONE-ACETAMINOPHEN 10-325 MG PO TABS: 1.0000 | ORAL_TABLET | ORAL | 0 refills | Status: DC | PRN

## 2018-02-18 MED ORDER — PROMETHAZINE HCL 25 MG/ML IJ SOLN
6.2500 mg | INTRAMUSCULAR | Status: DC | PRN
  Filled 2018-02-18: qty 1

## 2018-02-18 MED ORDER — OXYCODONE-ACETAMINOPHEN 5-325 MG PO TABS
ORAL_TABLET | ORAL | Status: AC
  Filled 2018-02-18: qty 1

## 2018-02-18 MED ORDER — CIPROFLOXACIN IN D5W 400 MG/200ML IV SOLN
400.0000 mg | Freq: Once | INTRAVENOUS | Status: AC
  Administered 2018-02-18: 400 mg via INTRAVENOUS
  Filled 2018-02-18: qty 200

## 2018-02-18 MED ORDER — KETOROLAC TROMETHAMINE 30 MG/ML IJ SOLN
INTRAMUSCULAR | Status: DC | PRN
  Administered 2018-02-18: 30 mg via INTRAVENOUS

## 2018-02-18 MED ORDER — ONDANSETRON HCL 4 MG/2ML IJ SOLN
INTRAMUSCULAR | Status: DC | PRN
  Administered 2018-02-18: 4 mg via INTRAVENOUS

## 2018-02-18 MED ORDER — MIDAZOLAM HCL 5 MG/5ML IJ SOLN
INTRAMUSCULAR | Status: DC | PRN
  Administered 2018-02-18: 2 mg via INTRAVENOUS

## 2018-02-18 MED ORDER — DEXAMETHASONE SODIUM PHOSPHATE 4 MG/ML IJ SOLN
INTRAMUSCULAR | Status: DC | PRN
  Administered 2018-02-18: 10 mg via INTRAVENOUS

## 2018-02-18 MED ORDER — LACTATED RINGERS IV SOLN
INTRAVENOUS | Status: DC
  Administered 2018-02-18 (×2): via INTRAVENOUS
  Filled 2018-02-18: qty 1000

## 2018-02-18 MED ORDER — TAMSULOSIN HCL 0.4 MG PO CAPS
0.4000 mg | ORAL_CAPSULE | ORAL | 0 refills | Status: DC
Start: 2018-02-18 — End: 2019-10-27

## 2018-02-18 MED ORDER — LIDOCAINE HCL (CARDIAC) PF 100 MG/5ML IV SOSY
PREFILLED_SYRINGE | INTRAVENOUS | Status: DC | PRN
  Administered 2018-02-18: 100 mg via INTRAVENOUS

## 2018-02-18 MED ORDER — PROPOFOL 10 MG/ML IV BOLUS
INTRAVENOUS | Status: AC
  Filled 2018-02-18: qty 40

## 2018-02-18 MED ORDER — IOHEXOL 300 MG/ML  SOLN
INTRAMUSCULAR | Status: DC | PRN
  Administered 2018-02-18: 10 mL via URETHRAL

## 2018-02-18 MED ORDER — LIDOCAINE 2% (20 MG/ML) 5 ML SYRINGE
INTRAMUSCULAR | Status: AC
  Filled 2018-02-18: qty 5

## 2018-02-18 MED ORDER — DEXAMETHASONE SODIUM PHOSPHATE 10 MG/ML IJ SOLN
INTRAMUSCULAR | Status: AC
Start: 2018-02-18 — End: ?
  Filled 2018-02-18: qty 1

## 2018-02-18 MED ORDER — SODIUM CHLORIDE 0.9 % IR SOLN
Status: DC | PRN
  Administered 2018-02-18: 3000 mL via INTRAVESICAL

## 2018-02-18 MED ORDER — ONDANSETRON HCL 4 MG/2ML IJ SOLN
INTRAMUSCULAR | Status: AC
  Filled 2018-02-18: qty 2

## 2018-02-18 MED ORDER — CIPROFLOXACIN IN D5W 400 MG/200ML IV SOLN
INTRAVENOUS | Status: AC
  Filled 2018-02-18: qty 200

## 2018-02-18 MED ORDER — MIDAZOLAM HCL 2 MG/2ML IJ SOLN
INTRAMUSCULAR | Status: AC
  Filled 2018-02-18: qty 2

## 2018-02-18 MED ORDER — FENTANYL CITRATE (PF) 100 MCG/2ML IJ SOLN
25.0000 ug | INTRAMUSCULAR | Status: DC | PRN
  Filled 2018-02-18: qty 1

## 2018-02-18 MED ORDER — SCOPOLAMINE 1 MG/3DAYS TD PT72
1.0000 | MEDICATED_PATCH | TRANSDERMAL | Status: DC
  Administered 2018-02-18: 1.5 mg via TRANSDERMAL
  Filled 2018-02-18: qty 1

## 2018-02-18 MED ORDER — FENTANYL CITRATE (PF) 100 MCG/2ML IJ SOLN
INTRAMUSCULAR | Status: AC
  Filled 2018-02-18: qty 2

## 2018-02-18 MED ORDER — KETOROLAC TROMETHAMINE 30 MG/ML IJ SOLN
INTRAMUSCULAR | Status: AC
  Filled 2018-02-18: qty 1

## 2018-02-18 MED ORDER — OXYCODONE-ACETAMINOPHEN 5-325 MG PO TABS
1.0000 | ORAL_TABLET | ORAL | Status: DC | PRN
  Administered 2018-02-18: 1 via ORAL
  Filled 2018-02-18: qty 1

## 2018-02-18 MED ORDER — FENTANYL CITRATE (PF) 100 MCG/2ML IJ SOLN
INTRAMUSCULAR | Status: DC | PRN
  Administered 2018-02-18: 50 ug via INTRAVENOUS
  Administered 2018-02-18 (×2): 25 ug via INTRAVENOUS
  Administered 2018-02-18 (×2): 50 ug via INTRAVENOUS

## 2018-02-18 MED ORDER — SCOPOLAMINE 1 MG/3DAYS TD PT72
MEDICATED_PATCH | TRANSDERMAL | Status: AC
  Filled 2018-02-18: qty 1

## 2018-02-18 MED ORDER — PROPOFOL 10 MG/ML IV BOLUS
INTRAVENOUS | Status: DC | PRN
  Administered 2018-02-18: 200 mg via INTRAVENOUS
  Administered 2018-02-18: 100 mg via INTRAVENOUS

## 2018-02-18 SURGICAL SUPPLY — 29 items
BAG DRAIN URO-CYSTO SKYTR STRL (DRAIN) ×2 IMPLANT
BAG DRN UROCATH (DRAIN) ×1
BASKET ZERO TIP NITINOL 2.4FR (BASKET) IMPLANT
BSKT STON RTRVL ZERO TP 2.4FR (BASKET)
CATH INTERMIT  6FR 70CM (CATHETERS) IMPLANT
CATH URET 5FR 28IN CONE TIP (BALLOONS)
CATH URET 5FR 70CM CONE TIP (BALLOONS) IMPLANT
CATH URET DUAL LUMEN 6-10FR 50 (CATHETERS) ×1 IMPLANT
CLOTH BEACON ORANGE TIMEOUT ST (SAFETY) ×2 IMPLANT
EXTRACTOR STONE 1.7FRX115CM (UROLOGICAL SUPPLIES) ×1 IMPLANT
FIBER LASER FLEXIVA 365 (UROLOGICAL SUPPLIES) IMPLANT
FIBER LASER TRAC TIP (UROLOGICAL SUPPLIES) ×1 IMPLANT
GLOVE BIO SURGEON STRL SZ8 (GLOVE) ×2 IMPLANT
GOWN STRL REUS W/TWL XL LVL3 (GOWN DISPOSABLE) ×2 IMPLANT
GUIDEWIRE 0.038 PTFE COATED (WIRE) IMPLANT
GUIDEWIRE ANG ZIPWIRE 038X150 (WIRE) IMPLANT
GUIDEWIRE STR DUAL SENSOR (WIRE) ×3 IMPLANT
INFUSOR MANOMETER BAG 3000ML (MISCELLANEOUS) ×1 IMPLANT
IV NS IRRIG 3000ML ARTHROMATIC (IV SOLUTION) ×4 IMPLANT
KIT TURNOVER CYSTO (KITS) ×2 IMPLANT
MANIFOLD NEPTUNE II (INSTRUMENTS) IMPLANT
NS IRRIG 500ML POUR BTL (IV SOLUTION) ×2 IMPLANT
PACK CYSTO (CUSTOM PROCEDURE TRAY) ×2 IMPLANT
SHEATH ACCESS URETERAL 54CM (SHEATH) ×1 IMPLANT
SHEATH URETERAL 12FRX35CM (MISCELLANEOUS) ×1 IMPLANT
STENT POLARIS LOOP 6FR X 28 CM (STENTS) ×1 IMPLANT
TUBE CONNECTING 12X1/4 (SUCTIONS) IMPLANT
TUBING UROLOGY SET (TUBING) ×1 IMPLANT
WATER STERILE IRR 3000ML UROMA (IV SOLUTION) IMPLANT

## 2018-02-18 NOTE — Discharge Instructions (Signed)

## 2018-02-18 NOTE — Transfer of Care (Signed)
Immediate Anesthesia Transfer of Care Note  Patient: Peter Danielhomas R Hopkins Jr.  Procedure(s) Performed: CYSTOSCOPY/URETEROSCOPY/HOLMIUM LASER/STENT PLACEMENT. STONE BASKET RETRIEVAL (Right Ureter)  Patient Location: PACU  Anesthesia Type:General  Level of Consciousness: drowsy and responds to stimulation  Airway & Oxygen Therapy: Patient Spontanous Breathing and Patient connected to nasal cannula oxygen  Post-op Assessment: Report given to RN  Post vital signs: Reviewed and stable  Last Vitals:  Vitals Value Taken Time  BP 158/86 02/18/2018  8:48 AM  Temp    Pulse 78 02/18/2018  8:50 AM  Resp 21 02/18/2018  8:50 AM  SpO2 99 % 02/18/2018  8:50 AM  Vitals shown include unvalidated device data.  Last Pain:  Vitals:   02/18/18 0527  TempSrc: Oral  PainSc: 0-No pain      Patients Stated Pain Goal: 4 (02/18/18 0527)  Complications: No apparent anesthesia complications

## 2018-02-18 NOTE — Anesthesia Procedure Notes (Signed)
Procedure Name: LMA Insertion Date/Time: 02/18/2018 7:43 AM Performed by: Jessica PriestBeeson, Sanyia Dini C, CRNA Pre-anesthesia Checklist: Patient identified, Emergency Drugs available, Suction available and Patient being monitored Patient Re-evaluated:Patient Re-evaluated prior to induction Oxygen Delivery Method: Circle system utilized Preoxygenation: Pre-oxygenation with 100% oxygen Induction Type: IV induction Ventilation: Mask ventilation without difficulty LMA: LMA inserted LMA Size: 5.0 Number of attempts: 1 Airway Equipment and Method: Bite block Placement Confirmation: positive ETCO2 and breath sounds checked- equal and bilateral Tube secured with: Tape Dental Injury: Teeth and Oropharynx as per pre-operative assessment  Comments: Shoulders and upper body ramped with gray wedge support, gel pillow, soft blankets and pillow for correct positioning

## 2018-02-18 NOTE — Anesthesia Postprocedure Evaluation (Signed)
Anesthesia Post Note  Patient: Peter Danielhomas R Laflamme Jr.  Procedure(s) Performed: CYSTOSCOPY/URETEROSCOPY/HOLMIUM LASER/STENT PLACEMENT. STONE BASKET RETRIEVAL (Right Ureter)     Patient location during evaluation: PACU Anesthesia Type: General Level of consciousness: sedated Pain management: pain level controlled Vital Signs Assessment: post-procedure vital signs reviewed and stable Respiratory status: spontaneous breathing and respiratory function stable Cardiovascular status: stable Postop Assessment: no apparent nausea or vomiting Anesthetic complications: no    Last Vitals:  Vitals:   02/18/18 0900 02/18/18 0915  BP: (!) 153/85 (!) 149/82  Pulse: 61 61  Resp: 16 15  Temp:    SpO2: 95% 100%    Last Pain:  Vitals:   02/18/18 0930  TempSrc:   PainSc: 0-No pain                 Hoorain Kozakiewicz DANIEL

## 2018-02-18 NOTE — Op Note (Signed)
PATIENT:  Peter Owens.  PRE-OPERATIVE DIAGNOSIS: Right renal calculus  POST-OPERATIVE DIAGNOSIS: Same  PROCEDURE:  1. Cystoscopy with right retrograde pyelogram including interpretation. 2. Right ureteroscopy, laser lithotripsy, stone extraction and double-J stent placement. 3. Fluoroscopy time less than 1 hour  SURGEON: Garnett FarmMark C Caci Orren, MD  INDICATION: [name] is a 32 year old male with a history of stones.  He developed right flank pain and was found by imaging to have a 12 mm stone located at the UPJ.  It had Hounsfield units of 1000.  We discussed the options for management and he presents today for ureteroscopic treatment of his stone.  ANESTHESIA:  General  EBL:  Minimal  DRAINS: 6 French, 28 cm Polaris stent (with string)  SPECIMEN: Portion of stone taken for stone analysis  DESCRIPTION OF PROCEDURE: The patient was taken to the major OR and placed on the table. General anesthesia was administered and then the patient was moved to the dorsal lithotomy position. The genitalia was sterilely prepped and draped. An official timeout was performed.  Initially the 23 French cystoscope with 30 lens was passed under direct vision into the bladder. The bladder was then fully inspected. It was noted be free of any tumors, stones or inflammatory lesions. Ureteral orifices were of normal configuration and position. A 6 French open-ended ureteral catheter was then passed through the cystoscope into the ureteral orifice in order to perform a right retrograde pyelogram.  A retrograde pyelogram was performed by injecting full-strength contrast up the right ureter under direct fluoroscopic control. It revealed a filling defect in the area of the UPJ consistent with the stone seen on the preoperative imaging. The remainder of the ureter was noted to be normal as was the intrarenal collecting system. I then passed a 0.038 inch floppy-tipped guidewire through the open ended catheter and into  the area of the renal pelvis and this was left in place.  I then passed a dual-lumen ureteral catheter over the guidewire and placed a second sensor guidewire as a safety guidewire.  The dual-lumen catheter was removed and the safety guidewire was affixed to the drape.  I then passed a ureteral access sheath over the working guidewire to the area of the renal pelvis under fluoroscopy and remove the inner portion of the access sheath and working guidewire.  I then proceeded with ureteroscopy.  A 6 [place] digital, dual lumen ureteroscope was then passed through the access sheath into the area of the renal pelvis. The stone was identified and I felt it was too large to extract and therefore elected to proceed with laser lithotripsy.  The stone had migrated into an upper pole calyx.  The 200  holmium laser fiber was used to fragment the stone initially and then I eventually switched to a 365 m holmium laser fiber.  As the stone fragmented I intermittently stopped and used the [place] basket to extract fragments.  I then "dusted" the stone into fragments of 1 mm or less.  There was no injury to the intrarenal collecting system and further inspection revealed no other stones.  I therefore removed the ureteroscope and remove the ureteral access sheath.  I then backloaded the cystoscope over the guidewire and passed the stent over the guidewire into the area of the renal pelvis. As the guidewire was removed good curl was noted in the renal pelvis. The bladder was drained and the cystoscope was then removed. The patient tolerated the procedure well no intraoperative complications.  PLAN OF CARE:  Discharge to home after PACU  PATIENT DISPOSITION:  PACU - hemodynamically stable.

## 2018-02-21 ENCOUNTER — Encounter (HOSPITAL_BASED_OUTPATIENT_CLINIC_OR_DEPARTMENT_OTHER): Payer: Self-pay | Admitting: Urology

## 2019-04-09 ENCOUNTER — Emergency Department (HOSPITAL_BASED_OUTPATIENT_CLINIC_OR_DEPARTMENT_OTHER)
Admission: EM | Admit: 2019-04-09 | Discharge: 2019-04-09 | Disposition: A | Discharge status: Home / Self Care | Payer: BC Managed Care – PPO | Attending: Emergency Medicine | Admitting: Emergency Medicine

## 2019-04-09 ENCOUNTER — Encounter (HOSPITAL_BASED_OUTPATIENT_CLINIC_OR_DEPARTMENT_OTHER): Payer: Self-pay | Admitting: *Deleted

## 2019-04-09 ENCOUNTER — Other Ambulatory Visit: Payer: Self-pay

## 2019-04-09 DIAGNOSIS — Z87891 Personal history of nicotine dependence: Secondary | ICD-10-CM | POA: Diagnosis not present

## 2019-04-09 DIAGNOSIS — Y999 Unspecified external cause status: Secondary | ICD-10-CM | POA: Diagnosis not present

## 2019-04-09 DIAGNOSIS — W3182XA Contact with other commercial machinery, initial encounter: Secondary | ICD-10-CM | POA: Diagnosis not present

## 2019-04-09 DIAGNOSIS — Z79899 Other long term (current) drug therapy: Secondary | ICD-10-CM | POA: Insufficient documentation

## 2019-04-09 DIAGNOSIS — S61209A Unspecified open wound of unspecified finger without damage to nail, initial encounter: Secondary | ICD-10-CM

## 2019-04-09 DIAGNOSIS — Y9389 Activity, other specified: Secondary | ICD-10-CM | POA: Diagnosis not present

## 2019-04-09 DIAGNOSIS — S61011A Laceration without foreign body of right thumb without damage to nail, initial encounter: Secondary | ICD-10-CM | POA: Diagnosis present

## 2019-04-09 DIAGNOSIS — Y929 Unspecified place or not applicable: Secondary | ICD-10-CM | POA: Insufficient documentation

## 2019-04-09 MED ORDER — BACITRACIN ZINC 500 UNIT/GM EX OINT
TOPICAL_OINTMENT | Freq: Once | CUTANEOUS | Status: DC
Start: 2019-04-09 — End: 2019-04-10

## 2019-04-09 NOTE — ED Notes (Signed)
Wound dressed with bacitracin and gauze.

## 2019-04-09 NOTE — ED Provider Notes (Signed)
[facility] [place] EMERGENCY DEPARTMENT Provider Note   CSN: [id number] Arrival date & time: 04/09/19  2058     History   Chief Complaint Chief Complaint  Patient presents with  . Wound Check    HPI Demonte Dobratz. is a 33 y.o. male.     HPI 33 year old male presents with right thumb avulsion.  He was using a meat slicer a couple hours ago and accidentally sliced his thumb.  Had a very difficult time controlling of bleeding.  Bleeding has finally stopped once he arrived here.  Some tingling at the site of injury but otherwise no numbness or trouble moving his thumb.  Tetanus is up-to-date.  Past Medical History:  Diagnosis Date  . Allergy   . Arthritis   . GAD (generalized anxiety disorder)   . GERD (gastroesophageal reflux disease)   . History of kidney stones 2019   R UPJ Stone  . Low testosterone 2019  . 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 Poison oak 02/09/2018   on right arm.    Patient Active Problem List   Diagnosis Date Noted  . Visit for preventive health examination 12/03/2017  . Severe obesity (BMI >= 40) ([place]) 08/28/2013  . GERD (gastroesophageal reflux disease) 09/22/2011  . Anxiety 09/10/2011    Past Surgical History:  Procedure Laterality Date  . CYSTOSCOPY/URETEROSCOPY/HOLMIUM LASER/STENT PLACEMENT Right 02/18/2018   Procedure: CYSTOSCOPY/URETEROSCOPY/HOLMIUM LASER/STENT PLACEMENT. STONE BASKET RETRIEVAL;  Surgeon: Kathie Rhodes, MD;  Location: [facility];  Service: Urology;  Laterality: Right;  . ESOPHAGOGASTRODUODENOSCOPY    . TYMPANOSTOMY TUBE PLACEMENT          Home Medications    Prior to Admission medications   Medication Sig Start Date End Date Taking? Authorizing Provider  Testosterone (ANDROGEL) 20.25 MG/1.25GM (1.62%) GEL Place 3 Pump onto the skin daily.   Yes [provider]  cetirizine (ZYRTEC) 10 MG tablet Take 10 mg by mouth as needed for allergies.    [provider]  oxyCODONE-acetaminophen (PERCOCET) 10-325 MG tablet Take 1-2 tablets by mouth every 4 (four) hours as needed for pain. Maximum dose per 24 hours - 8 pills 02/18/18   Kathie Rhodes, MD  ranitidine (ZANTAC) 150 MG tablet Take 150 mg by mouth once.    [provider]  tamsulosin (FLOMAX) 0.4 MG CAPS capsule Take 1 capsule (0.4 mg total) by mouth daily after supper. 02/18/18   Kathie Rhodes, MD    Family History Family History  Problem Relation Age of Onset  . Diabetes Mother   . Hypertension Mother   . Heart disease Mother   . Arthritis Mother   . Thyroid disease Mother   . Hyperlipidemia Mother   . Mental illness Mother   . Hypertension Father   . Thyroid disease Father   . Heart disease Father   . Hyperlipidemia Father   . Mental illness Father   . Hypertension Sister   . Mental illness Sister   . Hyperlipidemia Maternal Grandmother   . Hypertension Maternal Grandmother   . Heart disease Maternal Grandmother   . Cancer Maternal Grandfather   . Diabetes Maternal Grandfather   . Hyperlipidemia Maternal Grandfather   . Hypertension Maternal Grandfather   . Heart disease Paternal Grandmother   . Hyperlipidemia Paternal Grandmother   . Hypertension Paternal Grandmother   . Hyperlipidemia Paternal Grandfather   . Hypertension Paternal Grandfather   . Heart disease Paternal Grandfather     Social History  Social History   Tobacco Use  . Smoking status: Former Smoker    Types: Cigarettes  . Smokeless tobacco: Former Neurosurgeon    Types: Chew    Quit date: 08/15/2011  Substance Use Topics  . Alcohol use: No  . Drug use: No     Allergies   Amoxicillin, Cephalosporins, and Penicillins   Review of Systems Review of Systems  Skin: Positive for wound.     Physical Exam Updated Vital Signs BP 133/80 (BP Location: Left Arm)   Pulse 79   Temp 99 F (37.2 C)   Resp 18   Ht 5\' 11"  (1.803 m)   Wt (!) 141.5 kg   SpO2 98%   BMI 43.52 kg/m   Physical  Exam Vitals signs and nursing note reviewed.  Constitutional:      Appearance: He is well-developed. He is obese.  HENT:     Head: Normocephalic and atraumatic.     Right Ear: External ear normal.     Left Ear: External ear normal.     Nose: Nose normal.  Eyes:     General:        Right eye: No discharge.        Left eye: No discharge.  Neck:     Musculoskeletal: Neck supple.  Cardiovascular:     Rate and Rhythm: Normal rate and regular rhythm.     Pulses:          Radial pulses are 2+ on the right side.  Pulmonary:     Effort: Pulmonary effort is normal.  Abdominal:     General: There is no distension.  Musculoskeletal:     Right hand: He exhibits laceration.       Hands:  Skin:    General: Skin is warm and dry.  Neurological:     Mental Status: He is alert.  Psychiatric:        Mood and Affect: Mood is not anxious.      ED Treatments / Results  Labs (all labs ordered are listed, but only abnormal results are displayed) Labs Reviewed - No data to display  EKG None  Radiology No results found.  Procedures Procedures (including critical care time)  Medications Ordered in ED Medications  bacitracin ointment (has no administration in time range)     Initial Impression / Assessment and Plan / ED Course  I have reviewed the triage vital signs and the nursing notes.  Pertinent labs & imaging results that were available during my care of the patient were reviewed by me and considered in my medical decision making (see chart for details).        Patient presents with avulsion from meat slicer.  Bleeding is controlled.  Will apply antibiotic ointment and discussed wound care but otherwise there is nothing to suture.  Does not appear significantly deep enough to be concerned about bony injury.  Neurovascular intact.  Discussed bleeding control if this were to recur.  Tetanus is up-to-date  Final Clinical Impressions(s) / ED Diagnoses   Final diagnoses:   Fingertip avulsion, initial encounter    ED Discharge Orders    None       , MD 04/09/19 2152

## 2019-04-09 NOTE — ED Triage Notes (Signed)
Pt reports he cut the side of his right thumb on a slicer this afternoon. Reports difficulty getting the bleeding to stop

## 2019-04-09 NOTE — Discharge Instructions (Signed)
Apply the bacitracin 2 times per day for the next week or so.  Keep the area clean.  If you develop signs of infection such as drainage, increasing pain, skin color change such as redness, fever, etc. then return to the ER for evaluation.

## 2019-10-27 ENCOUNTER — Other Ambulatory Visit: Payer: Self-pay

## 2019-10-27 ENCOUNTER — Ambulatory Visit (INDEPENDENT_AMBULATORY_CARE_PROVIDER_SITE_OTHER): Payer: BC Managed Care – PPO | Admitting: Physician Assistant

## 2019-10-27 ENCOUNTER — Encounter: Payer: Self-pay | Admitting: Physician Assistant

## 2019-10-27 VITALS — BP 120/82 | HR 80 | Temp 99.1°F | Resp 16 | Ht 71.0 in | Wt 292.0 lb

## 2019-10-27 DIAGNOSIS — Z0000 Encounter for general adult medical examination without abnormal findings: Secondary | ICD-10-CM | POA: Diagnosis not present

## 2019-10-27 NOTE — Progress Notes (Signed)
Patient presents to clinic today for annual exam.  Patient is fasting for labs.  Acute Concerns: Patient denies acute concerns at today's visit  Health Maintenance: Immunizations --tetanus up-to-date.  Has not had Covid vaccines.  Past Medical History:  Diagnosis Date  . Allergy   . Arthritis   . GAD (generalized anxiety disorder)   . GERD (gastroesophageal reflux disease)   . History of kidney stones 2019   R UPJ Stone  . Low testosterone 2019  . 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 Poison oak 02/09/2018   on right arm.    Past Surgical History:  Procedure Laterality Date  . CYSTOSCOPY/URETEROSCOPY/HOLMIUM LASER/STENT PLACEMENT Right 02/18/2018   Procedure: CYSTOSCOPY/URETEROSCOPY/HOLMIUM LASER/STENT PLACEMENT. STONE BASKET RETRIEVAL;  Surgeon: Kathie Rhodes, MD;  Location: [facility];  Service: Urology;  Laterality: Right;  . ESOPHAGOGASTRODUODENOSCOPY    . TYMPANOSTOMY TUBE PLACEMENT      Current Outpatient Medications on File Prior to Visit  Medication Sig Dispense Refill  . cetirizine (ZYRTEC) 10 MG tablet Take 10 mg by mouth as needed for allergies.    . ranitidine (ZANTAC) 150 MG tablet Take 150 mg by mouth once.    . Testosterone 20.25 MG/ACT (1.62%) GEL Apply 3 Pump topically daily.     No current facility-administered medications on file prior to visit.    Allergies  Allergen Reactions  . Amoxicillin   . Cephalosporins Hives  . Penicillins Hives    childhood    Family History  Problem Relation Age of Onset  . Diabetes Mother   . Hypertension Mother   . Heart disease Mother   . Arthritis Mother   . Thyroid disease Mother   . Hyperlipidemia Mother   . Mental illness Mother   . Hypertension Father   . Thyroid disease Father   . Heart disease Father   . Hyperlipidemia Father   . Mental illness Father   . Hypertension Sister   . Mental illness Sister   . Hyperlipidemia Maternal Grandmother   .  Hypertension Maternal Grandmother   . Heart disease Maternal Grandmother   . Cancer Maternal Grandfather   . Diabetes Maternal Grandfather   . Hyperlipidemia Maternal Grandfather   . Hypertension Maternal Grandfather   . Heart disease Paternal Grandmother   . Hyperlipidemia Paternal Grandmother   . Hypertension Paternal Grandmother   . Hyperlipidemia Paternal Grandfather   . Hypertension Paternal Grandfather   . Heart disease Paternal Grandfather     Social History   Socioeconomic History  . Marital status: Married    Spouse name: Not on file  . Number of children: Not on file  . Years of education: Not on file  . Highest education level: Not on file  Occupational History  . Not on file  Tobacco Use  . Smoking status: Former Smoker    Types: Cigarettes  . Smokeless tobacco: Former Systems developer    Types: [place] date: 08/15/2011  Substance and Sexual Activity  . Alcohol use: No  . Drug use: No  . Sexual activity: Yes  Other Topics Concern  . Not on file  Social History Narrative  . Not on file   Social Determinants of Health   Financial Resource Strain:   . Difficulty of Paying Living Expenses:   Food Insecurity:   . Worried About Charity fundraiser in the Last Year:   . [place] in the Last Year:  Transportation Needs:   . Freight forwarder (Medical):   Marland Kitchen Lack of Transportation (Non-Medical):   Physical Activity:   . Days of Exercise per Week:   . Minutes of Exercise per Session:   Stress:   . Feeling of Stress :   Social Connections:   . Frequency of Communication with Friends and Family:   . Frequency of Social Gatherings with Friends and Family:   . Attends Religious Services:   . Active Member of Clubs or Organizations:   . Attends Banker Meetings:   Marland Kitchen Marital Status:   Intimate Partner Violence:   . Fear of Current or Ex-Partner:   . Emotionally Abused:   Marland Kitchen Physically Abused:   . Sexually Abused:    Review of Systems    Constitutional: Negative for fever and weight loss.  HENT: Negative for ear discharge, ear pain, hearing loss and tinnitus.   Eyes: Negative for blurred vision, double vision, photophobia and pain.  Respiratory: Negative for cough and shortness of breath.   Cardiovascular: Negative for chest pain and palpitations.  Gastrointestinal: Negative for abdominal pain, blood in stool, constipation, diarrhea, heartburn, melena, nausea and vomiting.  Genitourinary: Negative for dysuria, flank pain, frequency, hematuria and urgency.  Musculoskeletal: Negative for falls.  Neurological: Negative for dizziness, loss of consciousness and headaches.  Endo/Heme/Allergies: Negative for environmental allergies.  Psychiatric/Behavioral: Negative for depression, hallucinations, substance abuse and suicidal ideas. The patient is not nervous/anxious and does not have insomnia.     BP 120/82   Pulse 80   Temp 99.1 F (37.3 C) (Temporal)   Resp 16   Ht 5\' 11"  (1.803 m)   Wt 292 lb (132.5 kg)   SpO2 99%   BMI 40.73 kg/m   Physical Exam Vitals reviewed.  Constitutional:      General: He is not in acute distress.    Appearance: He is well-developed. He is not diaphoretic.  HENT:     Head: Normocephalic and atraumatic.     Right Ear: Tympanic membrane, ear canal and external ear normal.     Left Ear: Tympanic membrane, ear canal and external ear normal.     Nose: Nose normal.     Mouth/Throat:     Pharynx: No posterior oropharyngeal erythema.  Eyes:     Conjunctiva/sclera: Conjunctivae normal.     Pupils: Pupils are equal, round, and reactive to light.  Neck:     Thyroid: No thyromegaly.  Cardiovascular:     Rate and Rhythm: Normal rate and regular rhythm.     Heart sounds: Normal heart sounds.  Pulmonary:     Effort: Pulmonary effort is normal. No respiratory distress.     Breath sounds: Normal breath sounds. No wheezing or rales.  Chest:     Chest wall: No tenderness.  Abdominal:      General: Bowel sounds are normal. There is no distension.     Palpations: Abdomen is soft. There is no mass.     Tenderness: There is no abdominal tenderness. There is no guarding or rebound.  Musculoskeletal:     Cervical back: Neck supple.  Lymphadenopathy:     Cervical: No cervical adenopathy.  Skin:    General: Skin is warm and dry.     Findings: No rash.  Neurological:     Mental Status: He is alert and oriented to person, place, and time.     Cranial Nerves: No cranial nerve deficit.     Assessment/Plan: 1. Visit for preventive health  examination Depression screen negative. Health Maintenance reviewed. Preventive schedule discussed and handout given in AVS. Will obtain fasting labs today.  - CBC with Differential/Platelet - Comprehensive metabolic panel - Hemoglobin A1c - Lipid panel  2. Severe obesity (BMI >= 40) (HCC) Dietary and exercise recommendations reviewed with patient.  Continue testosterone per urology.  We will repeat fasting labs today to further risk stratify. - CBC with Differential/Platelet - Comprehensive metabolic panel - Lipid panel  This visit occurred during the SARS-CoV-2 public health emergency.  Safety protocols were in place, including screening questions prior to the visit, additional usage of staff PPE, and extensive cleaning of exam room while observing appropriate contact time as indicated for disinfecting solutions.      Piedad Climes, PA-C

## 2019-10-27 NOTE — Patient Instructions (Signed)
Please go to the lab for blood work.   Our office will call you with your results unless you have chosen to receive results via MyChart.  If your blood work is normal we will follow-up each year for physicals and as scheduled for chronic medical problems.  If anything is abnormal we will treat accordingly and get you in for a follow-up.   Preventive Care 35-35 Years Old, Male Preventive care refers to lifestyle choices and visits with your health care provider that can promote health and wellness. This includes:  A yearly physical exam. This is also called an annual well check.  Regular dental and eye exams.  Immunizations.  Screening for certain conditions.  Healthy lifestyle choices, such as eating a healthy diet, getting regular exercise, not using drugs or products that contain nicotine and tobacco, and limiting alcohol use. What can I expect for my preventive care visit? Physical exam Your health care provider will check:  Height and weight. These may be used to calculate body mass index (BMI), which is a measurement that tells if you are at a healthy weight.  Heart rate and blood pressure.  Your skin for abnormal spots. Counseling Your health care provider may ask you questions about:  Alcohol, tobacco, and drug use.  Emotional well-being.  Home and relationship well-being.  Sexual activity.  Eating habits.  Work and work Statistician. What immunizations do I need?  Influenza (flu) vaccine  This is recommended every year. Tetanus, diphtheria, and pertussis (Tdap) vaccine  You may need a Td booster every 10 years. Varicella (chickenpox) vaccine  You may need this vaccine if you have not already been vaccinated. Human papillomavirus (HPV) vaccine  If recommended by your health care provider, you may need three doses over 6 months. Measles, mumps, and rubella (MMR) vaccine  You may need at least one dose of MMR. You may also need a second  dose. Meningococcal conjugate (MenACWY) vaccine  One dose is recommended if you are 17-45 years old and a Market researcher living in a residence hall, or if you have one of several medical conditions. You may also need additional booster doses. Pneumococcal conjugate (PCV13) vaccine  You may need this if you have certain conditions and were not previously vaccinated. Pneumococcal polysaccharide (PPSV23) vaccine  You may need one or two doses if you smoke cigarettes or if you have certain conditions. Hepatitis A vaccine  You may need this if you have certain conditions or if you travel or work in places where you may be exposed to hepatitis A. Hepatitis B vaccine  You may need this if you have certain conditions or if you travel or work in places where you may be exposed to hepatitis B. Haemophilus influenzae type b (Hib) vaccine  You may need this if you have certain risk factors. You may receive vaccines as individual doses or as more than one vaccine together in one shot (combination vaccines). Talk with your health care provider about the risks and benefits of combination vaccines. What tests do I need? Blood tests  Lipid and cholesterol levels. These may be checked every 5 years starting at age 35.  Hepatitis C test.  Hepatitis B test. Screening   Diabetes screening. This is done by checking your blood sugar (glucose) after you have not eaten for a while (fasting).  Sexually transmitted disease (STD) testing. Talk with your health care provider about your test results, treatment options, and if necessary, the need for more tests. Follow these  instructions at home: Eating and drinking   Eat a diet that includes fresh fruits and vegetables, whole grains, lean protein, and low-fat dairy products.  Take vitamin and mineral supplements as recommended by your health care provider.  Do not drink alcohol if your health care provider tells you not to drink.  If you  drink alcohol: ? Limit how much you have to 0-2 drinks a day. ? Be aware of how much alcohol is in your drink. In the [place] one drink equals one 12 oz bottle of beer (355 mL), one 5 oz glass of wine (148 mL), or one 1 oz glass of hard liquor (44 mL). Lifestyle  Take daily care of your teeth and gums.  Stay active. Exercise for at least 30 minutes on 5 or more days each week.  Do not use any products that contain nicotine or tobacco, such as cigarettes, e-cigarettes, and chewing tobacco. If you need help quitting, ask your health care provider.  If you are sexually active, practice safe sex. Use a condom or other form of protection to prevent STIs (sexually transmitted infections). What's next?  Go to your health care provider once a year for a well check visit.  Ask your health care provider how often you should have your eyes and teeth checked.  Stay up to date on all vaccines. This information is not intended to replace advice given to you by your health care provider. Make sure you discuss any questions you have with your health care provider. Document Revised: 06/23/2018 Document Reviewed: 06/23/2018 Elsevier Patient Education  2020 Elsevier Inc. .      

## 2019-10-30 LAB — CBC WITH DIFFERENTIAL/PLATELET
Absolute Monocytes: 742 cells/uL (ref 200–950)
Basophils Absolute: 43 cells/uL (ref 0–200)
Basophils Relative: 0.6 %
Eosinophils Absolute: 72 cells/uL (ref 15–500)
Eosinophils Relative: 1 %
HCT: 48.7 % (ref 38.5–50.0)
Hemoglobin: 16.8 g/dL (ref 13.2–17.1)
Lymphs Abs: 2045 cells/uL (ref 850–3900)
MCH: 30.5 pg (ref 27.0–33.0)
MCHC: 34.5 g/dL (ref 32.0–36.0)
MCV: 88.5 fL (ref 80.0–100.0)
MPV: 10.3 fL (ref 7.5–12.5)
Monocytes Relative: 10.3 %
Neutro Abs: 4298 cells/uL (ref 1500–7800)
Neutrophils Relative %: 59.7 %
Platelets: 371 10*3/uL (ref 140–400)
RBC: 5.5 10*6/uL (ref 4.20–5.80)
RDW: 12.7 % (ref 11.0–15.0)
Total Lymphocyte: 28.4 %
WBC: 7.2 10*3/uL (ref 3.8–10.8)

## 2019-10-30 LAB — HEMOGLOBIN A1C
Hgb A1c MFr Bld: 4.8 % of total Hgb (ref <5.7)
Mean Plasma Glucose: 91 (calc)
eAG (mmol/L): 5 (calc)

## 2019-10-30 LAB — COMPREHENSIVE METABOLIC PANEL
AG Ratio: 2.2 (calc) (ref 1.0–2.5)
ALT: 22 U/L (ref 9–46)
AST: 22 U/L (ref 10–40)
Albumin: 4.9 g/dL (ref 3.6–5.1)
Alkaline phosphatase (APISO): 60 U/L (ref 36–130)
BUN: 16 mg/dL (ref 7–25)
CO2: 26 mmol/L (ref 20–32)
Calcium: 10.2 mg/dL (ref 8.6–10.3)
Chloride: 103 mmol/L (ref 98–110)
Creat: 0.89 mg/dL (ref 0.60–1.35)
Globulin: 2.2 g/dL (calc) (ref 1.9–3.7)
Glucose, Bld: 89 mg/dL (ref 65–99)
Potassium: 4.7 mmol/L (ref 3.5–5.3)
Sodium: 141 mmol/L (ref 135–146)
Total Bilirubin: 0.6 mg/dL (ref 0.2–1.2)
Total Protein: 7.1 g/dL (ref 6.1–8.1)

## 2019-10-30 LAB — TEST AUTHORIZATION

## 2019-10-30 LAB — LIPID PANEL
Cholesterol: 173 mg/dL (ref <200)
HDL: 37 mg/dL — ABNORMAL LOW (ref >=40)
LDL Cholesterol (Calc): 113 mg/dL (calc) — ABNORMAL HIGH
Non-HDL Cholesterol (Calc): 136 mg/dL (calc) — ABNORMAL HIGH (ref <130)
Total CHOL/HDL Ratio: 4.7 (calc) (ref <5.0)
Triglycerides: 120 mg/dL (ref <150)

## 2020-02-12 ENCOUNTER — Encounter: Payer: Self-pay | Admitting: Physician Assistant

## 2020-02-12 DIAGNOSIS — E291 Testicular hypofunction: Secondary | ICD-10-CM

## 2020-02-19 ENCOUNTER — Encounter: Payer: Self-pay | Admitting: Physician Assistant

## 2020-02-26 ENCOUNTER — Other Ambulatory Visit: Payer: Self-pay | Admitting: Urology

## 2020-03-07 ENCOUNTER — Encounter: Payer: Self-pay | Admitting: Physician Assistant

## 2020-03-07 ENCOUNTER — Ambulatory Visit (INDEPENDENT_AMBULATORY_CARE_PROVIDER_SITE_OTHER): Payer: BC Managed Care – PPO | Admitting: Physician Assistant

## 2020-03-07 ENCOUNTER — Other Ambulatory Visit: Payer: Self-pay | Admitting: Emergency Medicine

## 2020-03-07 ENCOUNTER — Other Ambulatory Visit: Payer: Self-pay

## 2020-03-07 VITALS — BP 130/90 | HR 82 | Temp 98.7°F | Resp 16 | Ht 71.0 in | Wt 281.0 lb

## 2020-03-07 DIAGNOSIS — F411 Generalized anxiety disorder: Secondary | ICD-10-CM

## 2020-03-07 LAB — TSH: TSH: 0.76 u[IU]/mL (ref 0.35–4.50)

## 2020-03-07 MED ORDER — HYDROXYZINE HCL 10 MG PO TABS: 10.0000 mg | ORAL_TABLET | Freq: Three times a day (TID) | ORAL | 0 refills | Status: DC | PRN

## 2020-03-07 MED ORDER — ESCITALOPRAM OXALATE 20 MG PO TABS: ORAL_TABLET | ORAL | 1 refills | Status: DC

## 2020-03-07 NOTE — Patient Instructions (Signed)
Please go to the lab today for blood work.  I will call you with your results. We will alter treatment regimen(s) if indicated by your results.   Please start the Lexapro taking 1/2 tablet daily x 2 weeks. Then increase to 1 tablet daily.  The hydroxyzine is to be used no more than as directed for severe acute anxiety only.   Download the headspace app on your phone to work on mindfulness training techniques. There are a lot of great books online for mindfulness training as well.  Use the handout given to schedule a counseling appointment.   Follow-up with me in 4 weeks. Sooner if needed.   Hang in there!

## 2020-03-07 NOTE — Progress Notes (Signed)
Patient presents to clinic today c/o anxiety.   Always had some level of generalized anxiety. Over the past 2 weeks noting a substantial increase in anxiety levels, with panic attack on occasional. Has sweating spells when really anxious. Noted decreased appetite and sleep. Notes some depressed mood with anhedonia. Denies any SI/HI. Notes his wife having a lot of issues recently, PMDD, etc causing her issue which worries him. When younger was on a combination of Paxil and Xanax but he did not like how this made him feel.    Past Medical History:  Diagnosis Date  . Allergy   . Arthritis   . GAD (generalized anxiety disorder)   . GERD (gastroesophageal reflux disease)   . History of kidney stones 2019   R UPJ Stone  . Low testosterone 2019  . 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 Poison oak 02/09/2018   on right arm.    Current Outpatient Medications on File Prior to Visit  Medication Sig Dispense Refill  . cetirizine (ZYRTEC) 10 MG tablet Take 10 mg by mouth as needed for allergies.    . ranitidine (ZANTAC) 150 MG tablet Take 150 mg by mouth once.    . Testosterone 20.25 MG/ACT (1.62%) GEL Apply 3 Pump topically daily.     No current facility-administered medications on file prior to visit.    Allergies  Allergen Reactions  . Amoxicillin   . Cephalosporins Hives  . Penicillins Hives    childhood    Family History  Problem Relation Age of Onset  . Diabetes Mother   . Hypertension Mother   . Heart disease Mother   . Arthritis Mother   . Thyroid disease Mother   . Hyperlipidemia Mother   . Mental illness Mother   . Hypertension Father   . Thyroid disease Father   . Heart disease Father   . Hyperlipidemia Father   . Mental illness Father   . Hypertension Sister   . Mental illness Sister   . Hyperlipidemia Maternal Grandmother   . Hypertension Maternal Grandmother   . Heart disease Maternal Grandmother   . Cancer Maternal Grandfather         74s  . Diabetes Maternal Grandfather   . Hyperlipidemia Maternal Grandfather   . Hypertension Maternal Grandfather   . Heart disease Paternal Grandmother   . Hyperlipidemia Paternal Grandmother   . Hypertension Paternal Grandmother   . Hyperlipidemia Paternal Grandfather   . Hypertension Paternal Grandfather   . Heart disease Paternal Grandfather     Social History   Socioeconomic History  . Marital status: Married    Spouse name: Not on file  . Number of children: Not on file  . Years of education: Not on file  . Highest education level: Not on file  Occupational History  . Not on file  Tobacco Use  . Smoking status: Former Smoker    Types: Cigarettes  . Smokeless tobacco: Former Neurosurgeon    Types: Chew    Quit date: 08/15/2011  Vaping Use  . Vaping Use: Never used  Substance and Sexual Activity  . Alcohol use: No  . Drug use: No  . Sexual activity: Yes  Other Topics Concern  . Not on file  Social History Narrative  . Not on file   Social Determinants of Health   Financial Resource Strain:   . Difficulty of Paying Living Expenses: Not on file  Food Insecurity:   . Worried About [employer] of  Food in the Last Year: Not on file  . Ran Out of Food in the Last Year: Not on file  Transportation Needs:   . Lack of Transportation (Medical): Not on file  . Lack of Transportation (Non-Medical): Not on file  Physical Activity:   . Days of Exercise per Week: Not on file  . Minutes of Exercise per Session: Not on file  Stress:   . Feeling of Stress : Not on file  Social Connections:   . Frequency of Communication with Friends and Family: Not on file  . Frequency of Social Gatherings with Friends and Family: Not on file  . Attends Religious Services: Not on file  . Active Member of Clubs or Organizations: Not on file  . Attends Banker Meetings: Not on file  . Marital Status: Not on file   Review of Systems - See HPI.  All other ROS are negative.  Wt  281 lb (127.5 kg)   BMI 39.19 kg/m   Physical Exam Vitals reviewed.  Constitutional:      Appearance: Normal appearance.  HENT:     Head: Normocephalic and atraumatic.     Mouth/Throat:     Mouth: Mucous membranes are moist.  Eyes:     Conjunctiva/sclera: Conjunctivae normal.     Pupils: Pupils are equal, round, and reactive to light.  Neck:     Thyroid: No thyromegaly.  Cardiovascular:     Rate and Rhythm: Normal rate and regular rhythm.     Pulses: Normal pulses.     Heart sounds: Normal heart sounds.  Pulmonary:     Effort: Pulmonary effort is normal.     Breath sounds: Normal breath sounds.  Musculoskeletal:     Cervical back: Neck supple.  Neurological:     General: No focal deficit present.     Mental Status: He is alert and oriented to person, place, and time.  Psychiatric:        Thought Content: Thought content normal.        Judgment: Judgment normal.     Assessment/Plan: 1. Anxiety state GAD with recent deterioration in symptoms with more acute anxiety. Will check TSH today to make sure this is in normal range. Examination unremarkable. Will start combination of mindfulness training, behavioral therapy/counseling and pharmacotherapy. Mindfulness training recommendations reviewed. Counseling information given so this can be set up. Will start Lexapro 10 mg x 2 weeks. Then increase to 20 mg daily. Hydroxyzine PRN for acute anxiety.  - TSH  This visit occurred during the SARS-CoV-2 public health emergency.  Safety protocols were in place, including screening questions prior to the visit, additional usage of staff PPE, and extensive cleaning of exam room while observing appropriate contact time as indicated for disinfecting solutions.     Piedad Climes, PA-C

## 2020-03-12 ENCOUNTER — Other Ambulatory Visit (HOSPITAL_COMMUNITY)
Admission: RE | Admit: 2020-03-12 | Discharge: 2020-03-12 | Disposition: A | Discharge status: Home / Self Care | Payer: BC Managed Care – PPO | Source: Ambulatory Visit | Attending: Urology | Admitting: Urology

## 2020-03-12 DIAGNOSIS — Z01812 Encounter for preprocedural laboratory examination: Secondary | ICD-10-CM | POA: Diagnosis present

## 2020-03-12 DIAGNOSIS — Z20822 Contact with and (suspected) exposure to covid-19: Secondary | ICD-10-CM | POA: Insufficient documentation

## 2020-03-12 LAB — SARS CORONAVIRUS 2 (TAT 6-24 HRS): SARS Coronavirus 2: NEGATIVE

## 2020-03-13 NOTE — Progress Notes (Signed)
Talked with patient. Arrival time 0600. Instructions given, NPO after MN. Wife is the driver

## 2020-03-14 ENCOUNTER — Encounter (HOSPITAL_BASED_OUTPATIENT_CLINIC_OR_DEPARTMENT_OTHER)
Admission: RE | Disposition: A | Discharge status: Home / Self Care | Payer: Self-pay | Source: Home / Self Care | Attending: Urology

## 2020-03-14 ENCOUNTER — Other Ambulatory Visit: Payer: Self-pay

## 2020-03-14 ENCOUNTER — Ambulatory Visit (HOSPITAL_BASED_OUTPATIENT_CLINIC_OR_DEPARTMENT_OTHER)
Admission: RE | Admit: 2020-03-14 | Discharge: 2020-03-14 | Disposition: A | Discharge status: Home / Self Care | Payer: BC Managed Care – PPO | Attending: Urology | Admitting: Urology

## 2020-03-14 ENCOUNTER — Encounter (HOSPITAL_BASED_OUTPATIENT_CLINIC_OR_DEPARTMENT_OTHER): Payer: Self-pay | Admitting: Urology

## 2020-03-14 ENCOUNTER — Ambulatory Visit (HOSPITAL_COMMUNITY): Payer: BC Managed Care – PPO

## 2020-03-14 DIAGNOSIS — E291 Testicular hypofunction: Secondary | ICD-10-CM | POA: Insufficient documentation

## 2020-03-14 DIAGNOSIS — Z8719 Personal history of other diseases of the digestive system: Secondary | ICD-10-CM | POA: Insufficient documentation

## 2020-03-14 DIAGNOSIS — R310 Gross hematuria: Secondary | ICD-10-CM | POA: Insufficient documentation

## 2020-03-14 DIAGNOSIS — N200 Calculus of kidney: Secondary | ICD-10-CM | POA: Insufficient documentation

## 2020-03-14 DIAGNOSIS — Z8379 Family history of other diseases of the digestive system: Secondary | ICD-10-CM | POA: Diagnosis not present

## 2020-03-14 DIAGNOSIS — Z880 Allergy status to penicillin: Secondary | ICD-10-CM | POA: Diagnosis not present

## 2020-03-14 HISTORY — PX: EXTRACORPOREAL SHOCK WAVE LITHOTRIPSY: SHX1557

## 2020-03-14 SURGERY — LITHOTRIPSY, ESWL
Anesthesia: LOCAL | Laterality: Left

## 2020-03-14 MED ORDER — DIPHENHYDRAMINE HCL 25 MG PO CAPS
25.0000 mg | ORAL_CAPSULE | ORAL | Status: AC
  Administered 2020-03-14: 25 mg via ORAL

## 2020-03-14 MED ORDER — CIPROFLOXACIN HCL 500 MG PO TABS
500.0000 mg | ORAL_TABLET | ORAL | Status: AC
  Administered 2020-03-14: 500 mg via ORAL

## 2020-03-14 MED ORDER — OXYCODONE-ACETAMINOPHEN 5-325 MG PO TABS
ORAL_TABLET | ORAL | Status: AC
  Filled 2020-03-14: qty 1

## 2020-03-14 MED ORDER — OXYCODONE-ACETAMINOPHEN 5-325 MG PO TABS
1.0000 | ORAL_TABLET | Freq: Once | ORAL | Status: AC
  Administered 2020-03-14: 1 via ORAL

## 2020-03-14 MED ORDER — DIAZEPAM 5 MG PO TABS
ORAL_TABLET | ORAL | Status: AC
  Filled 2020-03-14: qty 2

## 2020-03-14 MED ORDER — DIPHENHYDRAMINE HCL 25 MG PO CAPS
ORAL_CAPSULE | ORAL | Status: AC
  Filled 2020-03-14: qty 1

## 2020-03-14 MED ORDER — HYDROCODONE-ACETAMINOPHEN 5-325 MG PO TABS
1.0000 | ORAL_TABLET | Freq: Four times a day (QID) | ORAL | 0 refills | Status: AC | PRN
Start: 2020-03-14 — End: 2021-03-14

## 2020-03-14 MED ORDER — CIPROFLOXACIN HCL 500 MG PO TABS
ORAL_TABLET | ORAL | Status: AC
  Filled 2020-03-14: qty 1

## 2020-03-14 MED ORDER — SODIUM CHLORIDE 0.9 % IV SOLN: INTRAVENOUS | Status: DC

## 2020-03-14 MED ORDER — TAMSULOSIN HCL 0.4 MG PO CAPS: 0.4000 mg | ORAL_CAPSULE | Freq: Every day | ORAL | 0 refills | Status: AC

## 2020-03-14 MED ORDER — DIAZEPAM 5 MG PO TABS
10.0000 mg | ORAL_TABLET | ORAL | Status: AC
  Administered 2020-03-14: 10 mg via ORAL

## 2020-03-14 NOTE — Op Note (Signed)
See [facility] OP note scanned into chart. Also because of the size, density, location and other factors that cannot be anticipated I feel this will likely be a staged procedure. This fact supersedes any indication in the scanned [facility] operative note to the contrary.  

## 2020-03-14 NOTE — H&P (Signed)
CC/HPI: Interval evaluation of low testosterone  HPI: Peter Owens is a 34 year-old male established patient who is here Interval f/u for low testosterone.   The patient was started on testosterone replacement therapy on. The patient was last seen 01/12/2019. The patient is currently taking Androgel 3 pumps for his testosterone replacement therapy.   Testosterone: 786.8.   Hemoglobin: 16.5.   PSA: 1.07.   Over the past 6 months the patient denies any progressive voiding symptoms including worsening frequency, urgency, or dysuria.   He does not have problems with erectile dysfunction. This condition would be considered of mild to moderate severity with no modifying factors or associated signs or symptoms other than as noted above.   04/04/18: He was found to have a low serum testosterone on several occasions and was evaluated further with prolactin, FSH, LH and estradiol levels which were all found to be normal. His testosterone was found to be low at 190.   05/08/19: He returns for follow-up of hypogonadism and has been doing well. He had a low serum testosterone level and significant decreased energy and libido. He seen improvement in the symptoms on testosterone replacement currently using 3 pumps of AndroGel each morning he is pleased with the results. He has no new voiding symptoms. He has no flank pain or other urologic complaints today.   -02/23/20-patient with history of male hypogonadism currently on testosterone replacement therapy utilizing AndroGel pump 3 pumps daily. Patient has not had any recent lab work in this regard. Patient also had history of stones in the past 1 requiring ureteroscopy and laser lithotripsy approximately 2 years ago. He has had some intermittent gross hematuria he states when he is working outside a lot. Has not had much in the way of flank pain nausea vomiting and has not been running any fever.  Urinalysis today is clear except for trace protein on dipstick.  KUB is reviewed today and shows a 6-7 mm calcification overlying the left lower caliceal area over the left renal contour. No obvious stones on the right. No stones or calcifications noted along the expected course of the ureters.       ALLERGIES: Amoxicillin TABS Penicillins    MEDICATIONS: Androgel 20.25 mg/1.25 gram per actuation (1.62 %) gel in metered-dose pump 3 Pumps Topical Q AM  Hydrocodone-Acetaminophen 5 mg-325 mg tablet 1-2 tablet PO Q 6 H PRN     GU PSH: Ureteroscopic laser litho, Right - 2019       PSH Notes: No Surgical Problems   NON-GU PSH: No Non-GU PSH    GU PMH: Primary hypogonadism (Stable), Will remain on AndroGel 3 pumps and follow-up in 1 year. - 05/08/2019, (Stable), I am going to start him on testosterone replacement since his wife has had a tubal ligation and will recheck his levels in 3 weeks., - 04/29/2018 Gross hematuria, It appears his hematuria very likely was secondary to the passage of a small stone. I do not see any ureteral stones now. - 01/12/2019 Renal calculus, Bilateral, There is a single small stone peripherally located in his left kidney. There is a punctate calcification seen in the right kidney. - 01/12/2019      PMH Notes: Calculus disease: He experienced left flank pain and was evaluated with a renal ultrasound in 9/91 revealing some mild fullness on that side. An ultrasound done in 3/93 for pain on the left side again revealed similar findings and this was evaluated further with an IVP in 3/95 which revealed no abnormality.  In 9/93 and IVP revealed what appeared to be a punctate stone located at the ureterovesical junction that eventually passed.  CT scan 03/02/13: 3 mm upper pole right renal calculus with known left renal calculi. At that time he had a 2 mm stone in the right ureter which he passed.  5/19 CT scan revealed a 12 mm stone in the right renal pelvis. He was having intermittent flank pain. There were no other right or left renal  calculi.  02/18/18: Right ureteroscopy and laser lithotripsy.  Stone analysis 8/19: Calcium oxalate monohydrate 60%, dihydrate 20% and calcium phosphate 20%.     NON-GU PMH: Anxiety, Anxiety (Symptom) - 2014 Personal history of other diseases of the digestive system, History of esophageal reflux - 2014    FAMILY HISTORY: Congestive Heart Failure - Runs In Family Diabetes - Runs In Family Family Health Status Number - Runs In Family Hematuria - Mother Hypertension - Runs In Family nephrolithiasis - Mother Thyroid Disorder - Runs In Family   SOCIAL HISTORY: Marital Status: Married Current Smoking Status: Patient has never smoked.  Does not use smokeless tobacco. Has never drank.  Does not use drugs. Drinks 3 caffeinated drinks per day. Has not had a blood transfusion.     Notes: Never a smoker, Tobacco use, Caffeine Use, Occupation:, Marital History - Currently Married, Alcohol Use   REVIEW OF SYSTEMS:    GU Review Male:   Patient denies frequent urination, hard to postpone urination, burning/ pain with urination, get up at night to urinate, leakage of urine, stream starts and stops, trouble starting your stream, have to strain to urinate , erection problems, and penile pain.  Gastrointestinal (Upper):   Patient denies nausea, vomiting, and indigestion/ heartburn.  Gastrointestinal (Lower):   Patient denies diarrhea and constipation.  Constitutional:   Patient denies fever, night sweats, weight loss, and fatigue.  Skin:   Patient denies skin rash/ lesion and itching.  Eyes:   Patient denies blurred vision and double vision.  Ears/ Nose/ Throat:   Patient denies sore throat and sinus problems.  Hematologic/Lymphatic:   Patient denies swollen glands and easy bruising.  Cardiovascular:   Patient denies leg swelling and chest pains.  Respiratory:   Patient denies cough and shortness of breath.  Endocrine:   Patient denies excessive thirst.  Musculoskeletal:   Patient denies back pain  and joint pain.  Neurological:   Patient reports headaches and dizziness.   Psychologic:   Patient denies depression and anxiety.   VITAL SIGNS:      02/23/2020 10:01 AM  Weight 276 lb / 125.19 kg  Height 72 in / 182.88 cm  BP 131/86 mmHg  Pulse 76 /min  Temperature 98.6 F / 37 C  BMI 37.4 kg/m   MULTI-SYSTEM PHYSICAL EXAMINATION:    Constitutional: Well-nourished. No physical deformities. Normally developed. Good grooming.  Neck: Neck symmetrical, not swollen. Normal tracheal position.  Respiratory: No labored breathing, no use of accessory muscles.   Cardiovascular: Normal temperature, normal extremity pulses, no swelling, no varicosities.  Lymphatic: No enlargement of neck, axillae, groin.  Skin: No paleness, no jaundice, no cyanosis. No lesion, no ulcer, no rash.  Neurologic / Psychiatric: Oriented to time, oriented to place, oriented to person. No depression, no anxiety, no agitation.  Gastrointestinal: No mass, no tenderness, no rigidity, non obese abdomen. No CVA tenderness  Eyes: Normal conjunctivae. Normal eyelids.  Ears, Nose, Mouth, and Throat: Left ear no scars, no lesions, no masses. Right ear no scars, no lesions,  no masses. Nose no scars, no lesions, no masses. Normal hearing. Normal lips.  Musculoskeletal: Normal gait and station of head and neck.     Complexity of Data:   05/02/19 10/25/18 12/17/17  PSA  Total PSA 1.07 ng/mL 1.48 ng/mL 1.64 ng/dl    37/16/96 78/93/81 01/75/10 12/17/17 12/03/17  Hormones  Testosterone, Total 786.8 ng/dL 258.5 ng/dL 277.8 ng/dL 242.35 pg/dL 361.4 pg/dL    PROCEDURES:         KUB - 74018  A single view of the abdomen is obtained.      Patient confirmed No Neulasta OnPro Device. KUB is reviewed today no obvious bony abnormalities. There is new calcification measuring about 6-7 mm in size overlying the left lower caliceal area of the left kidney. No obvious stones noted along the expected course of the ureters or over the right  kidney. There is a pelvic phlebolith on the right which is persistent from prior KUB is.            Urinalysis - 81003 Dipstick Dipstick Cont'd  Color: Yellow Bilirubin: Neg  Appearance: Clear Ketones: Neg  Specific Gravity: 1.025 Blood: Neg  pH: 6.5 Protein: Neg  Glucose: Neg Urobilinogen: 0.2    Nitrites: Neg    Leukocyte Esterase: Neg    Notes:      ASSESSMENT:      ICD-10 Details  1 GU:   Gross hematuria - R31.0 Undiagnosed New Problem  2   History of urolithiasis - Z87.442 Chronic, Stable  3   Primary hypogonadism - E29.1 Chronic, Stable  4   Renal calculus - N20.0 Undiagnosed New Problem   PLAN:           Orders Labs PSA, CBC with Diff, Total Testosterone, Estradiol  X-Rays: KUB          Schedule Return Visit/Planned Activity: 6 Months          Document Letter(s):  Created for Patient: Clinical Summary         Notes:   From testosterone standpoint will continue on AndroGel 1.62% at 3 pumps daily. From hematuria standpoint could be related to the new stone noted on today's KUB in the left kidney. We talked about treatment options. He wants to proceed with ESL. Risks and benefits discussed as outlined below. Will schedule accordingly in the near future.  I have discussed with the patient the risks and consequences of the procedure of extracorporeal shockwave lithotripsy to include, but not limited to: Bleeding, including bleeding in the urine, bleeding around the kidney with hematoma formation and rarely bleeding to the point of loss of the kidney, infection, damage to the surrounding structures including soft tissue and bowel perforation, residual stone fragments requiring the need for future treatments including endoscopic surgery, open surgery or percutaneous surgery. I have emphasized that study showed that up to 25% of patients will require additional procedures depending on stone size and composition. I have also discussed with the patient that the success rate  for ESWL is approximately 60-90% and depends on stone location and stone composition as well as preoperative stone size. The patient voices understanding of the risks and benefits of the above and consents to the procedure.

## 2020-03-14 NOTE — Discharge Instructions (Signed)
See [facility] discharge instructions in chart.  Post Anesthesia Home Care Instructions  Activity: Get plenty of rest for the remainder of the day. A responsible adult should stay with you for 24 hours following the procedure.  For the next 24 hours, DO NOT: -Drive a car -Operate machinery -Drink alcoholic beverages -Take any medication unless instructed by your physician -Make any legal decisions or sign important papers.  Meals: Start with liquid foods such as gelatin or soup. Progress to regular foods as tolerated. Avoid greasy, spicy, heavy foods. If nausea and/or vomiting occur, drink only clear liquids until the nausea and/or vomiting subsides. Call your physician if vomiting continues.  Special Instructions/Symptoms: Your throat may feel dry or sore from the anesthesia or the breathing tube placed in your throat during surgery. If this causes discomfort, gargle with warm salt water. The discomfort should disappear within 24 hours.  If you had a scopolamine patch placed behind your ear for the management of post- operative nausea and/or vomiting:  1. The medication in the patch is effective for 72 hours, after which it should be removed.  Wrap patch in a tissue and discard in the trash. Wash hands thoroughly with soap and water. 2. You may remove the patch earlier than 72 hours if you experience unpleasant side effects which may include dry mouth, dizziness or visual disturbances. 3. Avoid touching the patch. Wash your hands with soap and water after contact with the patch.    

## 2020-03-15 ENCOUNTER — Encounter (HOSPITAL_BASED_OUTPATIENT_CLINIC_OR_DEPARTMENT_OTHER): Payer: Self-pay | Admitting: Urology

## 2020-04-24 ENCOUNTER — Other Ambulatory Visit: Payer: Self-pay | Admitting: Physician Assistant

## 2020-04-24 DIAGNOSIS — F411 Generalized anxiety disorder: Secondary | ICD-10-CM

## 2020-05-04 ENCOUNTER — Other Ambulatory Visit: Payer: Self-pay | Admitting: Physician Assistant

## 2020-05-04 DIAGNOSIS — F411 Generalized anxiety disorder: Secondary | ICD-10-CM

## 2020-06-10 ENCOUNTER — Telehealth: Payer: Self-pay | Admitting: Physician Assistant

## 2020-06-10 NOTE — Telephone Encounter (Signed)
FYI

## 2020-06-10 NOTE — Telephone Encounter (Signed)
Patient has been contacted twice about his bariatric surgery.  He does not respond to messages.  This referral is being taken out of [facility] and will need to be re ordered if patient decides that he wants to have this done.

## 2020-06-14 ENCOUNTER — Other Ambulatory Visit: Payer: Self-pay | Admitting: Physician Assistant

## 2020-06-14 DIAGNOSIS — F411 Generalized anxiety disorder: Secondary | ICD-10-CM

## 2021-08-17 ENCOUNTER — Telehealth: Payer: BC Managed Care – PPO | Admitting: Nurse Practitioner

## 2021-08-17 DIAGNOSIS — J019 Acute sinusitis, unspecified: Secondary | ICD-10-CM

## 2021-08-17 DIAGNOSIS — B9689 Other specified bacterial agents as the cause of diseases classified elsewhere: Secondary | ICD-10-CM

## 2021-08-17 MED ORDER — DOXYCYCLINE HYCLATE 100 MG PO TABS: 100.0000 mg | ORAL_TABLET | Freq: Two times a day (BID) | ORAL | 0 refills | Status: AC

## 2021-08-17 NOTE — Progress Notes (Signed)

## 2021-08-17 NOTE — Progress Notes (Signed)
I have spent 5 minutes in review of e-visit questionnaire, review and updating patient chart, medical decision making and response to patient.  ° °Maddock Finigan W Alesandro Stueve, NP ° °  °

## 2021-09-02 ENCOUNTER — Telehealth: Payer: BC Managed Care – PPO | Admitting: Physician Assistant

## 2021-09-02 DIAGNOSIS — B9689 Other specified bacterial agents as the cause of diseases classified elsewhere: Secondary | ICD-10-CM

## 2021-09-02 DIAGNOSIS — J019 Acute sinusitis, unspecified: Secondary | ICD-10-CM

## 2021-09-02 MED ORDER — AZITHROMYCIN 250 MG PO TABS: ORAL_TABLET | ORAL | 0 refills | Status: AC

## 2021-09-02 NOTE — Progress Notes (Signed)
I have spent 5 minutes in review of e-visit questionnaire, review and updating patient chart, medical decision making and response to patient.   Aisha Greenberger Cody Donice Alperin, PA-C    

## 2021-09-02 NOTE — Progress Notes (Signed)
E-Visit for Sinus Problems  We are sorry that you are not feeling well.  Here is how we plan to help!  Based on what you have shared with me it looks like you have sinusitis.  Sinusitis is inflammation and infection in the sinus cavities of the head.  Based on your presentation I believe you most likely have Acute Bacterial Sinusitis.  This is an infection caused by bacteria and is treated with antibiotics. I have prescribed Azithromycin due to some residual sinus symptoms after Doxycycline given a few weeks ago. You may use an oral decongestant such as Mucinex D or if you have glaucoma or high blood pressure use plain Mucinex. Saline nasal spray help and can safely be used as often as needed for congestion.  If you develop worsening sinus pain, fever or notice severe headache and vision changes, or if symptoms are not better after completion of antibiotic, please schedule an appointment with a health care provider.    Sinus infections are not as easily transmitted as other respiratory infection, however we still recommend that you avoid close contact with loved ones, especially the very young and elderly.  Remember to wash your hands thoroughly throughout the day as this is the number one way to prevent the spread of infection!  If not resolving with this treatment, you will need to be evaluated in person.   Home Care: Only take medications as instructed by your medical team. Complete the entire course of an antibiotic. Do not take these medications with alcohol. A steam or ultrasonic humidifier can help congestion.  You can place a towel over your head and breathe in the steam from hot water coming from a faucet. Avoid close contacts especially the very young and the elderly. Cover your mouth when you cough or sneeze. Always remember to wash your hands.  Get Help Right Away If: You develop worsening fever or sinus pain. You develop a severe head ache or visual changes. Your symptoms persist  after you have completed your treatment plan.  Make sure you Understand these instructions. Will watch your condition. Will get help right away if you are not doing well or get worse.  Thank you for choosing an e-visit.  Your e-visit answers were reviewed by a board certified advanced clinical practitioner to complete your personal care plan. Depending upon the condition, your plan could have included both over the counter or prescription medications.  Please review your pharmacy choice. Make sure the pharmacy is open so you can pick up prescription now. If there is a problem, you may contact your provider through [employer] and have the prescription routed to another pharmacy.  Your safety is important to [place]. If you have drug allergies check your prescription carefully.   For the next 24 hours you can use MyChart to ask questions about today's visit, request a non-urgent call back, or ask for a work or school excuse. You will get an email in the next two days asking about your experience. I hope that your e-visit has been valuable and will speed your recovery.

## 2022-04-26 ENCOUNTER — Telehealth: Payer: BC Managed Care – PPO | Admitting: Nurse Practitioner

## 2022-04-26 DIAGNOSIS — J069 Acute upper respiratory infection, unspecified: Secondary | ICD-10-CM

## 2022-04-26 MED ORDER — AZELASTINE-FLUTICASONE 137-50 MCG/ACT NA SUSP: 1.0000 | Freq: Two times a day (BID) | NASAL | 0 refills | Status: DC

## 2022-04-26 MED ORDER — PROMETHAZINE-DM 6.25-15 MG/5ML PO SYRP: 5.0000 mL | ORAL_SOLUTION | Freq: Four times a day (QID) | ORAL | 0 refills | Status: DC | PRN

## 2022-04-26 MED ORDER — IBUPROFEN 600 MG PO TABS: 600.0000 mg | ORAL_TABLET | Freq: Three times a day (TID) | ORAL | 0 refills | Status: DC | PRN

## 2022-04-26 NOTE — Progress Notes (Signed)

## 2022-04-26 NOTE — Progress Notes (Signed)
I have spent 5 minutes in review of e-visit questionnaire, review and updating patient chart, medical decision making and response to patient.  ° °Leigh Blas W Sabreena Vogan, NP ° °  °

## 2022-06-28 ENCOUNTER — Telehealth: Payer: BC Managed Care – PPO | Admitting: Family

## 2022-06-28 DIAGNOSIS — J069 Acute upper respiratory infection, unspecified: Secondary | ICD-10-CM | POA: Diagnosis not present

## 2022-06-28 MED ORDER — FLUTICASONE PROPIONATE 50 MCG/ACT NA SUSP: 2.0000 | Freq: Every day | NASAL | 6 refills | Status: DC

## 2022-06-28 NOTE — Progress Notes (Signed)

## 2023-07-01 ENCOUNTER — Other Ambulatory Visit: Payer: Self-pay | Admitting: Family

## 2023-07-28 ENCOUNTER — Ambulatory Visit: Payer: Self-pay | Admitting: Family Medicine

## 2023-09-07 ENCOUNTER — Encounter: Payer: Self-pay | Admitting: Nurse Practitioner

## 2023-09-07 ENCOUNTER — Ambulatory Visit (INDEPENDENT_AMBULATORY_CARE_PROVIDER_SITE_OTHER): Payer: 59 | Admitting: Nurse Practitioner

## 2023-09-07 VITALS — BP 141/81 | HR 104 | Temp 98.0°F | Ht 72.0 in | Wt 341.8 lb

## 2023-09-07 DIAGNOSIS — Z800 Family history of malignant neoplasm of digestive organs: Secondary | ICD-10-CM

## 2023-09-07 DIAGNOSIS — F32A Depression, unspecified: Secondary | ICD-10-CM | POA: Diagnosis not present

## 2023-09-07 DIAGNOSIS — Z0001 Encounter for general adult medical examination with abnormal findings: Secondary | ICD-10-CM | POA: Diagnosis not present

## 2023-09-07 DIAGNOSIS — F419 Anxiety disorder, unspecified: Secondary | ICD-10-CM | POA: Diagnosis not present

## 2023-09-07 DIAGNOSIS — Z6841 Body Mass Index (BMI) 40.0 and over, adult: Secondary | ICD-10-CM | POA: Diagnosis not present

## 2023-09-07 DIAGNOSIS — G4733 Obstructive sleep apnea (adult) (pediatric): Secondary | ICD-10-CM | POA: Diagnosis not present

## 2023-09-07 DIAGNOSIS — G479 Sleep disorder, unspecified: Secondary | ICD-10-CM

## 2023-09-07 DIAGNOSIS — K5900 Constipation, unspecified: Secondary | ICD-10-CM | POA: Insufficient documentation

## 2023-09-07 DIAGNOSIS — R194 Change in bowel habit: Secondary | ICD-10-CM | POA: Insufficient documentation

## 2023-09-07 DIAGNOSIS — Z87442 Personal history of urinary calculi: Secondary | ICD-10-CM | POA: Insufficient documentation

## 2023-09-07 DIAGNOSIS — K219 Gastro-esophageal reflux disease without esophagitis: Secondary | ICD-10-CM

## 2023-09-07 DIAGNOSIS — R141 Gas pain: Secondary | ICD-10-CM | POA: Insufficient documentation

## 2023-09-07 NOTE — Progress Notes (Signed)
 New Patient Office Visit  Subjective   Patient ID: Peter Leeson., male    DOB: Feb 21, 1986  Age: 38 y.o. MRN: [id number]  CC:  Chief Complaint  Patient presents with   Establish Care   Weight Loss    Wants to discuss weight loss options    HPI Peter Chillemi. is 38 yrs old male presents 09/07/2023  to establish care concerns for weight loss, sleep apnea.  Past medical history of low testosterone and kidney stone currently under the care of nephrologist why he get his testing hysterogram injection.  Obesity: The patient is a 38-year-old male with a BMI of 46.36, presenting today to establish care and address concerns about weight loss. He was prescribed Metformin for weight loss in the past "not sure how metformin suppose to help with weight loss, I stopped taking it" He reports previous attempts at managing his weight through diet and walking, but without any noticeable results. The patient is interested in exploring weight loss medications. Education was provided on options including GLP-1 agonists, Adipex, and other weight loss medications. The patient has been instructed to maintain a log of his food intake and exercise regimen and bring it to his next visit for a possible discussion regarding weight loss medications.  Sleep Apnea: The patient's wife is concerned about his snoring and reports multiple episodes of apnea during the night. The patient is seeking a referral for a sleep study to evaluate for sleep apnea. He has noted poor sleep quality, which is impacting his overall health and well-being. No specific treatment for sleep apnea has been started yet, and he is looking for further evaluation.  Possible HTN: The patient presents today with a blood pressure reading of 141/81 mmHg. He reports having had higher readings in the past but was never formally diagnosed with hypertension. The patient has been instructed to monitor his blood pressure at home twice daily (BID) and  bring his readings to his follow-up appointment for further evaluation and management.  Anxiety and depression: The patient is also dealing with symptoms of anxiety and depression, scoring 7 on both the GAD-7 and PHQ-9 screening tools. He was prescribed Lexapro in the past, but stopped taking it. He reports an increase in stress related to a recent promotion at work, coupled with the possibility of going through a divorce. He has been married for 16 years and has three children. The patient is seeking a referral for counseling to address his emotional and mental health concerns. He denies any suicidal ideation (SI) or homicidal ideation (HI).    09/07/2023    3:22 PM  GAD 7 : Generalized Anxiety Score  Nervous, Anxious, on Edge 1  Control/stop worrying 1  Worry too much - different things 1  Trouble relaxing 1  Restless 1  Easily annoyed or irritable 1  Afraid - awful might happen 1  Total GAD 7 Score 7  Anxiety Difficulty Somewhat difficult     Flowsheet Row Office Visit from 09/07/2023 in [facility]  PHQ-9 Total Score 7       Outpatient Encounter Medications as of 09/07/2023  Medication Sig   metFORMIN (GLUCOPHAGE-XR) 750 MG 24 hr tablet Take 750 mg by mouth daily.   testosterone cypionate (DEPOTESTOSTERONE CYPIONATE) 200 MG/ML injection Inject 100 mg into the muscle once a week.   hydrOXYzine (ATARAX/VISTARIL) 10 MG tablet TAKE 1 TABLET (10 MG TOTAL) BY MOUTH 3 (THREE) TIMES DAILY AS NEEDED FOR ANXIETY. (Patient  not taking: Reported on 09/07/2023)   [DISCONTINUED] Azelastine-Fluticasone 137-50 MCG/ACT SUSP Place 1 spray into the nose every 12 (twelve) hours. (Patient not taking: Reported on 09/07/2023)   [DISCONTINUED] cetirizine (ZYRTEC) 10 MG tablet Take 10 mg by mouth as needed for allergies. (Patient not taking: Reported on 09/07/2023)   [DISCONTINUED] escitalopram (LEXAPRO) 20 MG tablet TAKE 1/2 TABLET BY MOUTH DAILY FOR 2 WEEKS. THEN INCREASE TO 1  TABLET DAILY. (Patient not taking: Reported on 09/07/2023)   [DISCONTINUED] fluticasone (FLONASE) 50 MCG/ACT nasal spray Place 2 sprays into both nostrils daily. (Patient not taking: Reported on 09/07/2023)   [DISCONTINUED] ibuprofen (ADVIL) 600 MG tablet Take 1 tablet (600 mg total) by mouth every 8 (eight) hours as needed. (Patient not taking: Reported on 09/07/2023)   [DISCONTINUED] promethazine-dextromethorphan (PROMETHAZINE-DM) 6.25-15 MG/5ML syrup Take 5 mLs by mouth 4 (four) times daily as needed for cough. (Patient not taking: Reported on 09/07/2023)   [DISCONTINUED] ranitidine (ZANTAC) 150 MG tablet Take 150 mg by mouth once. (Patient not taking: Reported on 09/07/2023)   [DISCONTINUED] Testosterone 20.25 MG/ACT (1.62%) GEL Apply 3 Pump topically daily. (Patient not taking: Reported on 09/07/2023)   No facility-administered encounter medications on file as of 09/07/2023.    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    Past Surgical History:  Procedure Laterality Date   CYSTOSCOPY/URETEROSCOPY/HOLMIUM LASER/STENT PLACEMENT Right 02/18/2018   Procedure: CYSTOSCOPY/URETEROSCOPY/HOLMIUM LASER/STENT PLACEMENT. STONE BASKET RETRIEVAL;  Surgeon: Ihor Gully, MD;  Location: [facility];  Service: Urology;  Laterality: Right;   ESOPHAGOGASTRODUODENOSCOPY     EXTRACORPOREAL SHOCK WAVE LITHOTRIPSY Left 03/14/2020   Procedure: EXTRACORPOREAL SHOCK WAVE LITHOTRIPSY (ESWL);  Surgeon: Noel Christmas, MD;  Location: [facility];  Service: Urology;  Laterality: Left;   TYMPANOSTOMY TUBE PLACEMENT      Family History  Problem Relation Age of Onset   Diabetes Mother    Hypertension Mother     Heart disease Mother    Arthritis Mother    Thyroid disease Mother    Hyperlipidemia Mother    Mental illness Mother    Hypertension Father    Thyroid disease Father    Heart disease Father    Hyperlipidemia Father    Mental illness Father    Hypertension Sister    Mental illness Sister    Hyperlipidemia Maternal Grandmother    Hypertension Maternal Grandmother    Heart disease Maternal Grandmother    Cancer Maternal Grandfather        43s   Diabetes Maternal Grandfather    Hyperlipidemia Maternal Grandfather    Hypertension Maternal Grandfather    Heart disease Paternal Grandmother    Hyperlipidemia Paternal Grandmother    Hypertension Paternal Grandmother    Hyperlipidemia Paternal Grandfather    Hypertension Paternal Grandfather    Heart disease Paternal Grandfather     Social History   Socioeconomic History   Marital status: Married    Spouse name: Not on file   Number of children: Not on file   Years of education: Not on file   Highest education level: Not on file  Occupational History   Not on file  Tobacco  Use   Smoking status: Former    Types: Cigarettes   Smokeless tobacco: Former    Types: Chew    Quit date: 08/15/2011  Vaping Use   Vaping status: Never Used  Substance and Sexual Activity   Alcohol use: No   Drug use: No   Sexual activity: Yes  Other Topics Concern   Not on file  Social History Narrative   Not on file   Social Drivers of Health   Financial Resource Strain: Low Risk  (09/07/2023)   Overall Financial Resource Strain (CARDIA)    Difficulty of Paying Living Expenses: Not hard at all  Food Insecurity: No Food Insecurity (09/07/2023)   Hunger Vital Sign    Worried About Running Out of Food in the Last Year: Never true    Ran Out of Food in the Last Year: Never true  Transportation Needs: No Transportation Needs (09/07/2023)   PRAPARE - Administrator, Civil Service (Medical): No    Lack of Transportation (Non-Medical): No   Physical Activity: Insufficiently Active (09/07/2023)   Exercise Vital Sign    Days of Exercise per Week: 3 days    Minutes of Exercise per Session: 30 min  Stress: Not on file  Social Connections: Unknown (11/22/2021)   [facility]   Social Network    Social Network: Not on file  Intimate Partner Violence: Not At Risk (09/07/2023)   Humiliation, Afraid, Rape, and Kick questionnaire    Fear of Current or Ex-Partner: No    Emotionally Abused: No    Physically Abused: No    Sexually Abused: No    ROS Negative unless indicated in HPI    Objective   BP (!) 141/81   Pulse (!) 104   Temp 98 F (36.7 C) (Temporal)   Ht 6' (1.829 m)   Wt (!) 341 lb 12.8 oz (155 kg)   SpO2 95%   BMI 46.36 kg/m   Physical Exam Vitals and nursing note reviewed.  Constitutional:      General: He is not in acute distress.    Appearance: He is obese.  HENT:     Head: Normocephalic and atraumatic.     Right Ear: Tympanic membrane, ear canal and external ear normal. There is no impacted cerumen.     Left Ear: Tympanic membrane, ear canal and external ear normal. There is no impacted cerumen.     Nose: Nose normal.  Neurological:     Mental Status: He is alert.     Last CBC Lab Results  Component Value Date   WBC 7.2 10/27/2019   HGB 16.8 10/27/2019   HCT 48.7 10/27/2019   MCV 88.5 10/27/2019   MCH 30.5 10/27/2019   RDW 12.7 10/27/2019   PLT 371 10/27/2019   Last metabolic panel Lab Results  Component Value Date   GLUCOSE 89 10/27/2019   NA 141 10/27/2019   K 4.7 10/27/2019   CL 103 10/27/2019   CO2 26 10/27/2019   BUN 16 10/27/2019   CREATININE 0.89 10/27/2019   GFR 112.17 12/03/2017   CALCIUM 10.2 10/27/2019   PROT 7.1 10/27/2019   ALBUMIN 4.5 12/03/2017   BILITOT 0.6 10/27/2019   ALKPHOS 59 12/03/2017   AST 22 10/27/2019   ALT 22 10/27/2019   ANIONGAP 8 11/21/2017   Last lipids Lab Results  Component Value Date   CHOL 173 10/27/2019    HDL 37 (L) 10/27/2019   LDLCALC 113 (H) 10/27/2019  TRIG 120 10/27/2019   CHOLHDL 4.7 10/27/2019   Last hemoglobin A1c Lab Results  Component Value Date   HGBA1C 4.8 10/27/2019   Last thyroid functions Lab Results  Component Value Date   TSH 0.76 03/07/2020        Assessment & Plan:  Encounter for general adult medical examination with abnormal findings -     CBC with Differential/Platelet -     CMP14+EGFR -     Lipid panel -     Thyroid Panel With TSH  Sleep disorder -     Ambulatory referral to Sleep Studies  Obstructive sleep apnea syndrome -     Ambulatory referral to Sleep Studies  Anxiety and depression -     Amb ref to Integrated Behavioral Health  Severe obesity (BMI >= 40) (HCC) -     Ambulatory referral to Sleep Studies -     Amb Ref to Medical Weight Management  Gastroesophageal reflux disease without esophagitis -     CMP14+EGFR  Family history of colon cancer requiring screening colonoscopy -     Cologuard  Labs: CBC, CMP, lipid, TSH results pending Sleep apnea : Referral for sleep study Family history of colon cancer: Cologuard ordered client instructed to mail the sample out within 72 hours of receiving the package Anxiety and depression: Client declined medication is referral to behavioral health for counseling Low testosterone: He will continue follow-up with nephrology for his injection Obesity: Referral to medical weight management, client to keep a diary of food and exercise and bring it back at his next follow-up appointment Possible hypertension: Client to keep a log of blood pressure twice a day in the morning and in the evening and bring that log back to his next follow-up appointment  Encourage healthy lifestyle choices, including diet (rich in fruits, vegetables, and lean proteins, and low in salt and simple carbohydrates) and exercise (at least 30 minutes of moderate physical activity daily).     The above assessment and management  plan was discussed with the patient. The patient verbalized understanding of and has agreed to the management plan. Patient is aware to call the clinic if they develop any new symptoms or if symptoms persist or worsen. Patient is aware when to return to the clinic for a follow-up visit. Patient educated on when it is appropriate to go to the emergency department.  Return in about 2 months (around 11/05/2023) for weight loss medication.   Arrie Aran Santa Lighter, [facility] [address] [place], [place] [address] [phone]  Note: This document was prepared by [employer] voice dictation technology and any errors that results from this process are unintentional.

## 2023-09-08 LAB — CBC WITH DIFFERENTIAL/PLATELET
Basophils Absolute: 0 10*3/uL (ref 0.0–0.2)
Basos: 0 %
EOS (ABSOLUTE): 0.1 10*3/uL (ref 0.0–0.4)
Eos: 1 %
Hematocrit: 51.4 % — ABNORMAL HIGH (ref 37.5–51.0)
Hemoglobin: 17.8 g/dL — ABNORMAL HIGH (ref 13.0–17.7)
Immature Grans (Abs): 0 10*3/uL (ref 0.0–0.1)
Immature Granulocytes: 0 %
Lymphocytes Absolute: 2.6 10*3/uL (ref 0.7–3.1)
Lymphs: 28 %
MCH: 31.1 pg (ref 26.6–33.0)
MCHC: 34.6 g/dL (ref 31.5–35.7)
MCV: 90 fL (ref 79–97)
Monocytes Absolute: 0.8 10*3/uL (ref 0.1–0.9)
Monocytes: 8 %
Neutrophils Absolute: 5.9 10*3/uL (ref 1.4–7.0)
Neutrophils: 63 %
Platelets: 413 10*3/uL (ref 150–450)
RBC: 5.73 x10E6/uL (ref 4.14–5.80)
RDW: 13.1 % (ref 11.6–15.4)
WBC: 9.5 10*3/uL (ref 3.4–10.8)

## 2023-09-08 LAB — CMP14+EGFR
ALT: 38 IU/L (ref 0–44)
AST: 38 IU/L (ref 0–40)
Albumin: 4.6 g/dL (ref 4.1–5.1)
Alkaline Phosphatase: 79 IU/L (ref 44–121)
BUN/Creatinine Ratio: 12 (ref 9–20)
BUN: 10 mg/dL (ref 6–20)
Bilirubin Total: 0.5 mg/dL (ref 0.0–1.2)
CO2: 20 mmol/L (ref 20–29)
Calcium: 9.5 mg/dL (ref 8.7–10.2)
Chloride: 102 mmol/L (ref 96–106)
Creatinine, Ser: 0.84 mg/dL (ref 0.76–1.27)
Globulin, Total: 2.6 g/dL (ref 1.5–4.5)
Glucose: 91 mg/dL (ref 70–99)
Potassium: 4.3 mmol/L (ref 3.5–5.2)
Sodium: 140 mmol/L (ref 134–144)
Total Protein: 7.2 g/dL (ref 6.0–8.5)
eGFR: 114 mL/min/{1.73_m2} (ref >59)

## 2023-09-08 LAB — LIPID PANEL
Chol/HDL Ratio: 6.5 ratio — ABNORMAL HIGH (ref 0.0–5.0)
Cholesterol, Total: 209 mg/dL — ABNORMAL HIGH (ref 100–199)
HDL: 32 mg/dL — ABNORMAL LOW (ref >39)
LDL Chol Calc (NIH): 150 mg/dL — ABNORMAL HIGH (ref 0–99)
Triglycerides: 149 mg/dL (ref 0–149)
VLDL Cholesterol Cal: 27 mg/dL (ref 5–40)

## 2023-09-08 LAB — THYROID PANEL WITH TSH
Free Thyroxine Index: 2.3 (ref 1.2–4.9)
T3 Uptake Ratio: 27 % (ref 24–39)
T4, Total: 8.6 ug/dL (ref 4.5–12.0)
TSH: 1.24 u[IU]/mL (ref 0.450–4.500)

## 2023-09-09 ENCOUNTER — Telehealth: Payer: Self-pay

## 2023-09-09 ENCOUNTER — Other Ambulatory Visit: Payer: Self-pay

## 2023-09-09 DIAGNOSIS — E785 Hyperlipidemia, unspecified: Secondary | ICD-10-CM

## 2023-09-09 MED ORDER — ATORVASTATIN CALCIUM 10 MG PO TABS
10.0000 mg | ORAL_TABLET | Freq: Every day | ORAL | 1 refills | Status: DC
Start: 2023-09-09 — End: 2023-11-25

## 2023-09-09 NOTE — Telephone Encounter (Signed)
 Copied from CRM [id number]. Topic: Clinical - Lab/Test Results >> Sep 08, 2023  4:55 PM Fuller Mandril wrote: Reason for CRM: Patient returned call for results. Read note as written by provider. Patient understood and did not have questions or concerns. Note says wants to start Lipitor but do not show that sent to pharmacy. Thank You

## 2023-09-09 NOTE — Telephone Encounter (Signed)
 See result note.

## 2023-09-14 DIAGNOSIS — Z125 Encounter for screening for malignant neoplasm of prostate: Secondary | ICD-10-CM | POA: Diagnosis not present

## 2023-09-14 DIAGNOSIS — E291 Testicular hypofunction: Secondary | ICD-10-CM | POA: Diagnosis not present

## 2023-09-15 DIAGNOSIS — R0681 Apnea, not elsewhere classified: Secondary | ICD-10-CM | POA: Diagnosis not present

## 2023-10-01 DIAGNOSIS — G4733 Obstructive sleep apnea (adult) (pediatric): Secondary | ICD-10-CM | POA: Diagnosis not present

## 2023-10-01 DIAGNOSIS — E291 Testicular hypofunction: Secondary | ICD-10-CM | POA: Diagnosis not present

## 2023-10-01 DIAGNOSIS — Z87442 Personal history of urinary calculi: Secondary | ICD-10-CM | POA: Diagnosis not present

## 2023-11-01 DIAGNOSIS — G4733 Obstructive sleep apnea (adult) (pediatric): Secondary | ICD-10-CM | POA: Diagnosis not present

## 2023-11-08 ENCOUNTER — Ambulatory Visit: Payer: 59 | Admitting: Nurse Practitioner

## 2023-11-10 ENCOUNTER — Encounter: Payer: Self-pay | Admitting: Nurse Practitioner

## 2023-11-25 ENCOUNTER — Encounter: Payer: Self-pay | Admitting: Nurse Practitioner

## 2023-11-25 ENCOUNTER — Ambulatory Visit (INDEPENDENT_AMBULATORY_CARE_PROVIDER_SITE_OTHER): Admitting: Nurse Practitioner

## 2023-11-25 VITALS — BP 141/85 | HR 96 | Temp 97.7°F | Ht 72.0 in | Wt 344.0 lb

## 2023-11-25 DIAGNOSIS — E785 Hyperlipidemia, unspecified: Secondary | ICD-10-CM | POA: Insufficient documentation

## 2023-11-25 DIAGNOSIS — I10 Essential (primary) hypertension: Secondary | ICD-10-CM | POA: Diagnosis not present

## 2023-11-25 MED ORDER — SEMAGLUTIDE-WEIGHT MANAGEMENT 2.4 MG/0.75ML ~~LOC~~ SOAJ: 2.4000 mg | SUBCUTANEOUS | 0 refills | Status: AC

## 2023-11-25 MED ORDER — SEMAGLUTIDE-WEIGHT MANAGEMENT 0.25 MG/0.5ML ~~LOC~~ SOAJ: 0.2500 mg | SUBCUTANEOUS | 0 refills | Status: AC

## 2023-11-25 MED ORDER — ATORVASTATIN CALCIUM 10 MG PO TABS: 10.0000 mg | ORAL_TABLET | Freq: Every day | ORAL | 1 refills | Status: DC

## 2023-11-25 MED ORDER — SEMAGLUTIDE-WEIGHT MANAGEMENT 1.7 MG/0.75ML ~~LOC~~ SOAJ: 1.7000 mg | SUBCUTANEOUS | 0 refills | Status: AC

## 2023-11-25 MED ORDER — SEMAGLUTIDE-WEIGHT MANAGEMENT 0.5 MG/0.5ML ~~LOC~~ SOAJ: 0.5000 mg | SUBCUTANEOUS | 3 refills | Status: AC

## 2023-11-25 MED ORDER — SEMAGLUTIDE-WEIGHT MANAGEMENT 1 MG/0.5ML ~~LOC~~ SOAJ: 1.0000 mg | SUBCUTANEOUS | 0 refills | Status: AC

## 2023-11-25 MED ORDER — LOSARTAN POTASSIUM-HCTZ 50-12.5 MG PO TABS: 1.0000 | ORAL_TABLET | Freq: Every day | ORAL | 0 refills | Status: DC

## 2023-11-25 NOTE — Progress Notes (Signed)
 Established Patient Office Visit  Subjective  Patient ID: Peter Giovino., male    DOB: 1985/12/22  Age: 39 y.o. MRN: [id number]  Chief Complaint  Patient presents with   Medical Management of Chronic Issues    HPI Peter Bingen. is a 38 year old male present today Nov 25, 2023 for follow-up for chronic disease management. Reports that he has been checking his BP pressure at home and has been 140/90. He is interested on starting Wegovy since he has a dx of  HTN Obesity/weight loss management Peter Owens is a male patient with a BMI of 46.65 who presents for evaluation and management of obesity. He reports that he has been actively dieting and incorporating regular walking into his routine, but has seen only minimal progress with weight loss. He expresses interest in starting Wegovy (semaglutide) to support his weight loss efforts. He denies any personal or family history of medullary thyroid  carcinoma or multiple endocrine neoplasia syndrome type 2 (MEN 2). No current use of other weight loss medications is reported. He has  no contraindications to phentermine, Contrave & Qsymia (contains phentermine).    Hypertension Peter Problem: Patient is here for evaluation of elevated blood pressures.  Age at onset of elevated blood pressure:  141/85.Cardiac symptoms none. Patient denies none.  Cardiovascular risk factors: advanced age (older than 55 for men, 6 for women), dyslipidemia, male gender, and obesity (BMI >= 30 kg/m2). Use of agents associated with hypertension: none. History of target organ damage: none.   Lipid/Cholesterol, Follow-up  Last lipid panel Other pertinent labs  Lab Results  Component Value Date   CHOL 209 (H) 09/07/2023   HDL 32 (L) 09/07/2023   LDLCALC 150 (H) 09/07/2023   TRIG 149 09/07/2023   CHOLHDL 6.5 (H) 09/07/2023   Lab Results  Component Value Date   ALT 38 09/07/2023   AST 38 09/07/2023   PLT 413 09/07/2023   TSH 1.240 09/07/2023     He was last  seen for this 3 months ago.  Management since that visit includes Lipitor 10 mg .  He reports excellent compliance with treatment. He is not having side effects.   Symptoms: No chest pain No chest pressure/discomfort  No dyspnea No lower extremity edema  No numbness or tingling of extremity No orthopnea  No palpitations No paroxysmal nocturnal dyspnea  No speech difficulty No syncope   Current diet: in general, an "unhealthy" diet Current exercise: walking  The ASCVD Risk score (Arnett DK, et al., 2019) failed to calculate for the following reasons:   The 2019 ASCVD risk score is only valid for ages 64 to 62    Patient Active Problem List   Diagnosis Date Noted   Primary hypertension 11/25/2023   Hyperlipidemia 11/25/2023   Encounter for general adult medical examination with abnormal findings 09/07/2023   History of kidney stones 09/07/2023   Anxiety and depression 09/07/2023   Family history of colon cancer requiring screening colonoscopy 09/07/2023   Constipation 09/07/2023   Change in bowel habit 09/07/2023   Flatulence, eructation and gas pain 09/07/2023   Gastroesophageal reflux disease 09/07/2023   Visit for preventive health examination 12/03/2017   Severe obesity (BMI >= 40) (HCC) 08/28/2013   GERD (gastroesophageal reflux disease) 09/22/2011   Anxiety 09/10/2011   Past Medical History:  Diagnosis Date   Allergy    Arthritis    GAD (generalized anxiety disorder)    GERD (gastroesophageal reflux disease)    History of kidney stones 2019  R UPJ Stone   Low testosterone  2019   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   Past Surgical History:  Procedure Laterality Date   CYSTOSCOPY/URETEROSCOPY/HOLMIUM LASER/STENT PLACEMENT Right 02/18/2018   Procedure: CYSTOSCOPY/URETEROSCOPY/HOLMIUM LASER/STENT PLACEMENT. STONE BASKET RETRIEVAL;  Surgeon: Ottelin, Mark, MD;  Location: [facility];   Service: Urology;  Laterality: Right;   ESOPHAGOGASTRODUODENOSCOPY     EXTRACORPOREAL SHOCK WAVE LITHOTRIPSY Left 03/14/2020   Procedure: EXTRACORPOREAL SHOCK WAVE LITHOTRIPSY (ESWL);  Surgeon: Roxane Copp, MD;  Location[facility];  Service: Urology;  Laterality: Left;   TYMPANOSTOMY TUBE PLACEMENT     Social History   Tobacco Use   Smoking status: Former    Types: Cigarettes   Smokeless tobacco: Former    Types: Chew    Quit date: 08/15/2011  Vaping Use   Vaping status: Never Used  Substance Use Topics   Alcohol use: No   Drug use: No   Social History   Socioeconomic History   Marital status: Married    Spouse name: Not on file   Number of children: Not on file   Years of education: Not on file   Highest education level: 12th grade  Occupational History   Not on file  Tobacco Use   Smoking status: Former    Types: Cigarettes   Smokeless tobacco: Former    Types: Chew    Quit date: 08/15/2011  Vaping Use   Vaping status: Never Used  Substance and Sexual Activity   Alcohol use: No   Drug use: No   Sexual activity: Yes  Other Topics Concern   Not on file  Social History Narrative   Not on file   Social Drivers of Health   Financial Resource Strain: Low Risk  (11/24/2023)   Overall Financial Resource Strain (CARDIA)    Difficulty of Paying Living Expenses: Not very hard  Food Insecurity: No Food Insecurity (11/24/2023)   Hunger Vital Sign    Worried About Running Out of Food in the Last Year: Never true    Ran Out of Food in the Last Year: Never true  Transportation Needs: No Transportation Needs (11/24/2023)   PRAPARE - Administrator, Civil Service (Medical): No    Lack of Transportation (Non-Medical): No  Physical Activity: Insufficiently Active (11/24/2023)   Exercise Vital Sign    Days of Exercise per Week: 2 days    Minutes of Exercise per Session: 20 min  Stress: Stress Concern Present (11/24/2023)   [employer] of  Occupational Health - Occupational Stress Questionnaire    Feeling of Stress : To some extent  Social Connections: Moderately Integrated (11/24/2023)   Social Connection and Isolation Panel [NHANES]    Frequency of Communication with Friends and Family: More than three times a week    Frequency of Social Gatherings with Friends and Family: Once a week    Attends Religious Services: More than 4 times per year    Active Member of [employer] or Organizations: No    Attends Banker Meetings: Not on file    Marital Status: Married  Catering manager Violence: Not At Risk (09/07/2023)   Humiliation, Afraid, Rape, and Kick questionnaire    Fear of Current or Ex-Partner: No    Emotionally Abused: No    Physically Abused: No    Sexually Abused: No   Family Status  Relation Name Status  Mother  Alive   Father  Alive   Sister  Alive   Brother  Alive   Son  Alive   Brother  Alive   Brother  Alive   Brother  Alive   Sister  (Not Specified)   Sister  (Not Specified)   MGM  (Not Specified)   MGF  (Not Specified)   PGM  (Not Specified)   PGF  (Not Specified)  No partnership data on file   Family History  Problem Relation Age of Onset   Diabetes Mother    Hypertension Mother    Heart disease Mother    Arthritis Mother    Thyroid  disease Mother    Hyperlipidemia Mother    Mental illness Mother    Hypertension Father    Thyroid  disease Father    Heart disease Father    Hyperlipidemia Father    Mental illness Father    Hypertension Sister    Mental illness Sister    Hyperlipidemia Maternal Grandmother    Hypertension Maternal Grandmother    Heart disease Maternal Grandmother    Cancer Maternal Grandfather        110s   Diabetes Maternal Grandfather    Hyperlipidemia Maternal Grandfather    Hypertension Maternal Grandfather    Heart disease Paternal Grandmother    Hyperlipidemia Paternal Grandmother    Hypertension Paternal Grandmother    Hyperlipidemia Paternal  Grandfather    Hypertension Paternal Grandfather    Heart disease Paternal Grandfather    Allergies  Allergen Reactions   Amoxicillin    Cephalosporins Hives   Penicillins Hives    childhood      Review of Systems  Constitutional:  Negative for chills and fever.  HENT:  Negative for ear pain, sinus pain and sore throat.   Respiratory:  Negative for cough, shortness of breath and wheezing.   Cardiovascular:  Negative for chest pain and leg swelling.  Gastrointestinal:  Negative for constipation, diarrhea, nausea and vomiting.  Skin:  Negative for itching and rash.  Neurological:  Negative for dizziness and headaches.   Negative unless indicated in HPI   Objective:     BP (!) 141/85   Pulse 96   Temp 97.7 F (36.5 C) (Temporal)   Ht 6' (1.829 m)   Wt (!) 344 lb (156 kg)   SpO2 96%   BMI 46.65 kg/m  BP Readings from Last 3 Encounters:  11/25/23 (!) 141/85  09/07/23 (!) 141/81  03/14/20 138/90   Wt Readings from Last 3 Encounters:  11/25/23 (!) 344 lb (156 kg)  09/07/23 (!) 341 lb 12.8 oz (155 kg)  03/14/20 277 lb (125.6 kg)      Physical Exam Vitals and nursing note reviewed.  Constitutional:      General: He is not in acute distress. HENT:     Head: Normocephalic and atraumatic.     Nose: Nose normal.     Mouth/Throat:     Mouth: Mucous membranes are moist.  Eyes:     Extraocular Movements: Extraocular movements intact.     Conjunctiva/sclera: Conjunctivae normal.     Pupils: Pupils are equal, round, and reactive to light.  Cardiovascular:     Heart sounds: Normal heart sounds.  Pulmonary:     Effort: Pulmonary effort is normal.     Breath sounds: Normal breath sounds.  Abdominal:     General: Bowel sounds are normal.     Palpations: Abdomen is soft.  Musculoskeletal:  General: Normal range of motion.     Right lower leg: No edema.     Left lower leg: No edema.  Skin:    General: Skin is warm and dry.     Findings: No rash.   Neurological:     Mental Status: He is alert and oriented to person, place, and time.  Psychiatric:        Mood and Affect: Mood normal.        Behavior: Behavior normal.        Thought Content: Thought content normal.        Judgment: Judgment normal.      No results found for any visits on 11/25/23.  Last CBC Lab Results  Component Value Date   WBC 9.5 09/07/2023   HGB 17.8 (H) 09/07/2023   HCT 51.4 (H) 09/07/2023   MCV 90 09/07/2023   MCH 31.1 09/07/2023   RDW 13.1 09/07/2023   PLT 413 09/07/2023   Last metabolic panel Lab Results  Component Value Date   GLUCOSE 91 09/07/2023   NA 140 09/07/2023   K 4.3 09/07/2023   CL 102 09/07/2023   CO2 20 09/07/2023   BUN 10 09/07/2023   CREATININE 0.84 09/07/2023   EGFR 114 09/07/2023   CALCIUM  9.5 09/07/2023   PROT 7.2 09/07/2023   ALBUMIN 4.6 09/07/2023   LABGLOB 2.6 09/07/2023   BILITOT 0.5 09/07/2023   ALKPHOS 79 09/07/2023   AST 38 09/07/2023   ALT 38 09/07/2023   ANIONGAP 8 11/21/2017   Last lipids Lab Results  Component Value Date   CHOL 209 (H) 09/07/2023   HDL 32 (L) 09/07/2023   LDLCALC 150 (H) 09/07/2023   TRIG 149 09/07/2023   CHOLHDL 6.5 (H) 09/07/2023   Last hemoglobin A1c Lab Results  Component Value Date   HGBA1C 4.8 10/27/2019   Last thyroid  functions Lab Results  Component Value Date   TSH 1.240 09/07/2023   T4TOTAL 8.6 09/07/2023        Assessment & Plan:  Primary hypertension -     Losartan Potassium-HCTZ; Take 1 tablet by mouth daily.  Dispense: 90 tablet; Refill: 0  Hyperlipidemia, unspecified hyperlipidemia type -     Atorvastatin  Calcium ; Take 1 tablet (10 mg total) by mouth daily.  Dispense: 90 tablet; Refill: 1  Severe obesity (BMI >= 40) (HCC) -     Losartan Potassium-HCTZ; Take 1 tablet by mouth daily.  Dispense: 90 tablet; Refill: 0 -     Semaglutide-Weight Management; Inject 0.25 mg into the skin once a week for 28 days.  Dispense: 2 mL; Refill: 0 -      Semaglutide-Weight Management; Inject 0.5 mg into the skin once a week for 28 days.  Dispense: 0.5 mL; Refill: 3 -     Semaglutide-Weight Management; Inject 1 mg into the skin once a week for 28 days.  Dispense: 2 mL; Refill: 0 -     Semaglutide-Weight Management; Inject 1.7 mg into the skin once a week for 28 days.  Dispense: 3 mL; Refill: 0 -     Semaglutide-Weight Management; Inject 2.4 mg into the skin once a week for 28 days.  Dispense: 3 mL; Refill: 0     Peter Owens. 38 year old Caucasian male seen today for chronic disease management, no acute distress  HTN: Peter Dx Hayzaar 50-12.5 mg daily  DASH diet and exercise encouraged. Exercise at least 150 minutes per week and increase as tolerated. Goal BMI > 25. Stress management encouraged.  Avoid nicotine and tobacco product use. Avoid excessive alcohol and NSAID's. Avoid more than 2000 mg of sodium daily. Medications as prescribed. Follow up as scheduled.   Hyperlipidemia: Continue Lipitor 10 mg daily refill provided Obesity/Wegovy initial script (see EMR)  Tips for success with Wegovy (and by success, how not to be super sick on your stomach): Eat small meals AVOID heavy foods (fried/ high in carbs like bread, pasta, rice) AVOID carbonated beverages (soda/ beer, as these can increase bloating) DOUBLE your water intake (will help you avoid constipation/ dehydration)  XBJYNW CAN cause: Nausea Abdominal pain Increased acid reflux (sometimes presents as "sour burps") Constipation OR Diarrhea Fatigue (especially when you first start it)   Encourage healthy lifestyle choices, including diet (rich in fruits, vegetables, and lean proteins, and low in salt and simple carbohydrates) and exercise (at least 30 minutes of moderate physical activity daily).     The above assessment and management plan was discussed with the patient. The patient verbalized understanding of and has agreed to the management plan. Patient is aware to call  the clinic if they develop any Peter symptoms or if symptoms persist or worsen. Patient is aware when to return to the clinic for a follow-up visit. Patient educated on when it is appropriate to go to the emergency department.  Return in about 4 weeks (around 12/23/2023) for medication mangement.    Rehanna Oloughlin [place] Thompson, DNP [facility] [address] [place], [place] [address] [phone]    Note: This document was prepared by [employer] voice dictation technology and any errors that results from this process are unintentional.

## 2023-11-26 ENCOUNTER — Other Ambulatory Visit (HOSPITAL_COMMUNITY): Payer: Self-pay

## 2023-11-26 ENCOUNTER — Telehealth: Payer: Self-pay

## 2023-11-26 NOTE — Telephone Encounter (Signed)
 Pharmacy Patient Advocate Encounter   Received notification from CoverMyMeds that prior authorization for [facility] 0.25MG /0.5ML auto-injectors is required/requested.   However the pts plan requires that the pt to go through their service/providers called ''[employer]'' and get set up for weight loss drugs.  The pt will need to call ''[employer]'' at [phone]   Questions for Insurance: [phone]

## 2023-12-01 DIAGNOSIS — G4733 Obstructive sleep apnea (adult) (pediatric): Secondary | ICD-10-CM | POA: Diagnosis not present

## 2023-12-09 DIAGNOSIS — G4733 Obstructive sleep apnea (adult) (pediatric): Secondary | ICD-10-CM | POA: Diagnosis not present

## 2023-12-11 DIAGNOSIS — Z7189 Other specified counseling: Secondary | ICD-10-CM | POA: Diagnosis not present

## 2023-12-30 ENCOUNTER — Ambulatory Visit: Admitting: Nurse Practitioner

## 2023-12-30 NOTE — Progress Notes (Deleted)
   Established Patient Office Visit  Subjective  Patient ID: Peter Owens., male    DOB: 1986/02/12  Age: 38 y.o. MRN: [id number]  No chief complaint on file.   HPI  {History (Optional):23778}  ROS Negative unless indicated in HPI   Objective:     There were no vitals taken for this visit. {Vitals History (Optional):23777}  Physical Exam   No results found for any visits on 12/30/23.  {Labs (Optional):23779}    Assessment & Plan:  There are no diagnoses linked to this encounter.  No follow-ups on file.    @Devion Chriscoe  Hildy Lowers, [place]    Note: This document was prepared by [employer] voice dictation technology and any errors that results from this process are unintentional.

## 2024-01-01 DIAGNOSIS — G4733 Obstructive sleep apnea (adult) (pediatric): Secondary | ICD-10-CM | POA: Diagnosis not present

## 2024-01-04 ENCOUNTER — Encounter: Payer: Self-pay | Admitting: Nurse Practitioner

## 2024-01-10 DIAGNOSIS — Z6841 Body Mass Index (BMI) 40.0 and over, adult: Secondary | ICD-10-CM | POA: Diagnosis not present

## 2024-01-24 ENCOUNTER — Encounter: Payer: Self-pay | Admitting: Nurse Practitioner

## 2024-01-24 ENCOUNTER — Ambulatory Visit (INDEPENDENT_AMBULATORY_CARE_PROVIDER_SITE_OTHER): Admitting: Nurse Practitioner

## 2024-01-24 VITALS — BP 122/76 | HR 93 | Temp 97.2°F | Ht 72.0 in | Wt 318.4 lb

## 2024-01-24 DIAGNOSIS — I10 Essential (primary) hypertension: Secondary | ICD-10-CM

## 2024-01-24 DIAGNOSIS — F419 Anxiety disorder, unspecified: Secondary | ICD-10-CM

## 2024-01-24 MED ORDER — HYDROXYZINE PAMOATE 25 MG PO CAPS: 25.0000 mg | ORAL_CAPSULE | Freq: Three times a day (TID) | ORAL | 1 refills | Status: DC | PRN

## 2024-01-24 MED ORDER — LOSARTAN POTASSIUM-HCTZ 50-12.5 MG PO TABS: 1.0000 | ORAL_TABLET | Freq: Every day | ORAL | 1 refills | Status: DC

## 2024-01-24 NOTE — Progress Notes (Signed)
 Established Patient Office Visit  Subjective  Patient ID: Peter Haydel., male    DOB: Jul 17, 1985  Age: 38 y.o. MRN: [id number]  Chief Complaint  Patient presents with   Anxiety    Anxiety has gotten worse over the last month unsure of what is causing it    Peter Owensis a 38 yrs old male seen today for chronic diseases management and new concerns of increase anxiety.  Hypertension, follow-up  BP Readings from Last 3 Encounters:  01/24/24 122/76  11/25/23 (!) 141/85  09/07/23 (!) 141/81   Wt Readings from Last 3 Encounters:  01/24/24 (!) 318 lb 6.4 oz (144.4 kg)  11/25/23 (!) 344 lb (156 kg)  09/07/23 (!) 341 lb 12.8 oz (155 kg)     He was last seen for hypertension 1 months ago.  BP at that visit was 141/85. Management since that visit includes hyzaar 50-12.5 mg daily. He reports excellent compliance with treatment. Lost 30 lbs since his last visit  He is not having side effects.  He is following a Regular, Low fat diet. He is exercising. He does not smoke.  Use of agents associated with hypertension: none.   Outside blood pressures are not being monitored. Symptoms: No chest pain No chest pressure  No palpitations No syncope  No dyspnea No orthopnea  No paroxysmal nocturnal dyspnea No lower extremity edema   Pertinent labs Lab Results  Component Value Date   CHOL 209 (H) 09/07/2023   HDL 32 (L) 09/07/2023   LDLCALC 150 (H) 09/07/2023   TRIG 149 09/07/2023   CHOLHDL 6.5 (H) 09/07/2023   Lab Results  Component Value Date   NA 140 09/07/2023   K 4.3 09/07/2023   CREATININE 0.84 09/07/2023   EGFR 114 09/07/2023   GLUCOSE 91 09/07/2023   TSH 1.240 09/07/2023     The ASCVD Risk score (Arnett DK, et al., 2019) failed to calculate for the following reasons:   The 2019 ASCVD risk score is only valid for ages 75 to 33  Anxiety: Patient complains of anxiety disorder.  He has the following symptoms: irritable, racing thoughts, shortness of breath,  sweating. Onset of symptoms was approximately 1 month ago, gradually worsening since that time. He denies current suicidal and homicidal ideation. Family history significant for no psychiatric illness.Possible organic causes contributing are: none. Risk factors: none Previous treatment includes BuSpar, Lexapro , and Xanax  and none.  He complains of the following side effects from the treatment: none.  He is agreeable to trying Vistaril  as needed for 1 month if not successful will consider trying an SSRI  PHQ-9 Scores    01/24/2024    2:36 PM 09/07/2023    3:22 PM 10/27/2019    3:05 PM  PHQ9 SCORE ONLY  PHQ-9 Total Score 18 7 0    Patient Active Problem List   Diagnosis Date Noted   Primary hypertension 11/25/2023   Hyperlipidemia 11/25/2023   Encounter for general adult medical examination with abnormal findings 09/07/2023   History of kidney stones 09/07/2023   Anxiety and depression 09/07/2023   Family history of colon cancer requiring screening colonoscopy 09/07/2023   Constipation 09/07/2023   Change in bowel habit 09/07/2023   Flatulence, eructation and gas pain 09/07/2023   Gastroesophageal reflux disease 09/07/2023   Visit for preventive health examination 12/03/2017   Severe obesity (BMI >= 40) (HCC) 08/28/2013   GERD (gastroesophageal reflux disease) 09/22/2011   Anxiety 09/10/2011   Past Medical History:  Diagnosis  Date   Allergy    Arthritis    GAD (generalized anxiety disorder)    GERD (gastroesophageal reflux disease)    History of kidney stones 2019   R UPJ Stone   Low testosterone  2019   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   Past Surgical History:  Procedure Laterality Date   CYSTOSCOPY/URETEROSCOPY/HOLMIUM LASER/STENT PLACEMENT Right 02/18/2018   Procedure: CYSTOSCOPY/URETEROSCOPY/HOLMIUM LASER/STENT PLACEMENT. STONE BASKET RETRIEVAL;  Surgeon: Ottelin, Mark, MD;  Location: [facility];  Service: Urology;  Laterality: Right;   ESOPHAGOGASTRODUODENOSCOPY     EXTRACORPOREAL SHOCK WAVE LITHOTRIPSY Left 03/14/2020   Procedure: EXTRACORPOREAL SHOCK WAVE LITHOTRIPSY (ESWL);  Surgeon: Elisabeth Valli BIRCH, MD;  Location: [facility];  Service: Urology;  Laterality: Left;   TYMPANOSTOMY TUBE PLACEMENT     Social History   Tobacco Use   Smoking status: Former    Types: Cigarettes   Smokeless tobacco: Former    Types: Chew    Quit date: 08/15/2011  Vaping Use   Vaping status: Never Used  Substance Use Topics   Alcohol use: No   Drug use: No   Social History   Socioeconomic History   Marital status: Married    Spouse name: Not on file   Number of children: Not on file   Years of education: Not on file   Highest education level: 12th grade  Occupational History   Not on file  Tobacco Use   Smoking status: Former    Types: Cigarettes   Smokeless tobacco: Former    Types: Chew    Quit date: 08/15/2011  Vaping Use   Vaping status: Never Used  Substance and Sexual Activity   Alcohol use: No   Drug use: No   Sexual activity: Yes  Other Topics Concern   Not on file  Social History Narrative   Not on file   Social Drivers of Health   Financial Resource Strain: Low Risk  (11/24/2023)   Overall Financial Resource Strain (CARDIA)    Difficulty of Paying Living Expenses: Not very hard  Food Insecurity: No Food Insecurity (11/24/2023)   Hunger Vital Sign    Worried About Running Out of Food in the Last Year: Never true    Ran Out of Food in the Last Year: Never true  Transportation Needs: No Transportation Needs (11/24/2023)   PRAPARE - Administrator, Civil Service (Medical): No    Lack of Transportation (Non-Medical): No  Physical Activity: Insufficiently Active (11/24/2023)   Exercise Vital Sign    Days of Exercise per Week: 2 days    Minutes of Exercise per Session: 20 min  Stress: Stress Concern Present (11/24/2023)   [employer] of Occupational [employer] - Occupational Stress Questionnaire    Feeling of Stress : To some extent  Social Connections: Moderately Integrated (11/24/2023)   Social Connection and Isolation Panel    Frequency of Communication with Friends and Family: More than three times a week    Frequency of Social Gatherings with Friends and Family: Once a week    Attends Religious Services: More than 4 times per year    Active Member of [employer] or Organizations: No    Attends Banker Meetings: Not on file    Marital Status: Married  Intimate Partner Violence: Not At Risk (09/07/2023)   Humiliation, Afraid, Rape, and Kick questionnaire    Fear of Current  or Ex-Partner: No    Emotionally Abused: No    Physically Abused: No    Sexually Abused: No   Family Status  Relation Name Status   Mother  Alive   Father  Alive   Sister  Alive   Brother  Alive   Son  Alive   Brother  Alive   Brother  Alive   Brother  Alive   Sister  (Not Specified)   Sister  (Not Specified)   MGM  (Not Specified)   MGF  (Not Specified)   PGM  (Not Specified)   PGF  (Not Specified)  No partnership data on file   Family History  Problem Relation Age of Onset   Diabetes Mother    Hypertension Mother    Heart disease Mother    Arthritis Mother    Thyroid  disease Mother    Hyperlipidemia Mother    Mental illness Mother    Hypertension Father    Thyroid  disease Father    Heart disease Father    Hyperlipidemia Father    Mental illness Father    Hypertension Sister    Mental illness Sister    Hyperlipidemia Maternal Grandmother    Hypertension Maternal Grandmother    Heart disease Maternal Grandmother    Cancer Maternal Grandfather        24s   Diabetes Maternal Grandfather    Hyperlipidemia Maternal Grandfather    Hypertension Maternal Grandfather    Heart disease Paternal Grandmother    Hyperlipidemia Paternal Grandmother    Hypertension Paternal Grandmother    Hyperlipidemia Paternal  Grandfather    Hypertension Paternal Grandfather    Heart disease Paternal Grandfather    Allergies  Allergen Reactions   Amoxicillin    Cephalosporins Hives   Penicillins Hives    childhood      Review of Systems  Constitutional:  Negative for chills and fever.  HENT:  Negative for congestion and sore throat.   Respiratory:  Negative for cough and shortness of breath.   Gastrointestinal:  Negative for blood in stool, nausea and vomiting.  Skin:  Negative for itching and rash.  Neurological:  Negative for dizziness and headaches.  Psychiatric/Behavioral:  The patient is nervous/anxious.         Increase anxiety, feels like something bad is going to happen   Negative unless indicated in HPI   Objective:     BP 122/76   Pulse 93   Temp (!) 97.2 F (36.2 C) (Temporal)   Ht 6' (1.829 m)   Wt (!) 318 lb 6.4 oz (144.4 kg)   SpO2 95%   BMI 43.18 kg/m  BP Readings from Last 3 Encounters:  01/24/24 122/76  11/25/23 (!) 141/85  09/07/23 (!) 141/81   Wt Readings from Last 3 Encounters:  01/24/24 (!) 318 lb 6.4 oz (144.4 kg)  11/25/23 (!) 344 lb (156 kg)  09/07/23 (!) 341 lb 12.8 oz (155 kg)      Physical Exam Vitals and nursing note reviewed.  Constitutional:      General: He is not in acute distress.    Appearance: He is obese.  HENT:     Head: Normocephalic and atraumatic.     Nose: Nose normal.     Mouth/Throat:     Mouth: Mucous membranes are moist.  Eyes:     Extraocular Movements: Extraocular movements intact.     Conjunctiva/sclera: Conjunctivae normal.     Pupils: Pupils are equal, round, and reactive to light.  Cardiovascular:  Heart sounds: Normal heart sounds.  Pulmonary:     Effort: Pulmonary effort is normal.     Breath sounds: Normal breath sounds.  Skin:    General: Skin is warm.     Findings: No rash.  Neurological:     General: No focal deficit present.     Mental Status: He is oriented to person, place, and time.  Psychiatric:         Attention and Perception: Attention and perception normal.        Mood and Affect: Mood is anxious.        Speech: Speech normal.        Behavior: Behavior normal.        Thought Content: Thought content normal.        Cognition and Memory: Cognition and memory normal.        Judgment: Judgment normal.      No results found for any visits on 01/24/24.  Last CBC Lab Results  Component Value Date   WBC 9.5 09/07/2023   HGB 17.8 (H) 09/07/2023   HCT 51.4 (H) 09/07/2023   MCV 90 09/07/2023   MCH 31.1 09/07/2023   RDW 13.1 09/07/2023   PLT 413 09/07/2023   Last metabolic panel Lab Results  Component Value Date   GLUCOSE 91 09/07/2023   NA 140 09/07/2023   K 4.3 09/07/2023   CL 102 09/07/2023   CO2 20 09/07/2023   BUN 10 09/07/2023   CREATININE 0.84 09/07/2023   EGFR 114 09/07/2023   CALCIUM  9.5 09/07/2023   PROT 7.2 09/07/2023   ALBUMIN 4.6 09/07/2023   LABGLOB 2.6 09/07/2023   BILITOT 0.5 09/07/2023   ALKPHOS 79 09/07/2023   AST 38 09/07/2023   ALT 38 09/07/2023   ANIONGAP 8 11/21/2017   Last lipids Lab Results  Component Value Date   CHOL 209 (H) 09/07/2023   HDL 32 (L) 09/07/2023   LDLCALC 150 (H) 09/07/2023   TRIG 149 09/07/2023   CHOLHDL 6.5 (H) 09/07/2023   Last hemoglobin A1c Lab Results  Component Value Date   HGBA1C 4.8 10/27/2019   Last thyroid  functions Lab Results  Component Value Date   TSH 1.240 09/07/2023   T4TOTAL 8.6 09/07/2023        Assessment & Plan:  Primary hypertension -     Losartan  Potassium-HCTZ; Take 1 tablet by mouth daily.  Dispense: 90 tablet; Refill: 1  Anxiety -     hydrOXYzine  Pamoate; Take 1 capsule (25 mg total) by mouth every 8 (eight) hours as needed.  Dispense: 90 capsule; Refill: 1  Severe obesity (BMI >= 40) (HCC) -     Losartan  Potassium-HCTZ; Take 1 tablet by mouth daily.  Dispense: 90 tablet; Refill: 1  Anxiety: Restoral 25 mg as needed 3 times daily, will try it for a month if no relief will call  for possible SSRI or an NDRI HTN: BP well controlled. Changes none made in regimen continue hyzaar 50-12.5 mg daily. Goal BP is 130/80. Pt aware to report any persistent high or low readings. DASH diet and exercise encouraged. Exercise at least 150 minutes per week and increase as tolerated. Goal BMI > 25. Stress management encouraged. Avoid nicotine and tobacco product use. Avoid excessive alcohol and NSAID's. Avoid more than 2000 mg of sodium daily. Medications as prescribed. Follow up as scheduled.    Future: fasting Lipid  The above assessment and management plan was discussed with the patient. The patient verbalized understanding of and has  agreed to the management plan. Patient is aware to call the clinic if they develop any new symptoms or if symptoms persist or worsen. Patient is aware when to return to the clinic for a follow-up visit. Patient educated on when it is appropriate to go to the emergency department.  Return in about 6 months (around 07/26/2024).    Jevante Hollibaugh [place] Thompson, DNP [facility] [address] [place], [place] [address] [phone]    Note: This document was prepared by [employer] voice dictation technology and any errors that results from this process are unintentional.

## 2024-01-31 DIAGNOSIS — G4733 Obstructive sleep apnea (adult) (pediatric): Secondary | ICD-10-CM | POA: Diagnosis not present

## 2024-02-10 DIAGNOSIS — Z6841 Body Mass Index (BMI) 40.0 and over, adult: Secondary | ICD-10-CM | POA: Diagnosis not present

## 2024-02-15 ENCOUNTER — Other Ambulatory Visit: Payer: Self-pay | Admitting: Nurse Practitioner

## 2024-02-15 DIAGNOSIS — F419 Anxiety disorder, unspecified: Secondary | ICD-10-CM

## 2024-03-02 DIAGNOSIS — G4733 Obstructive sleep apnea (adult) (pediatric): Secondary | ICD-10-CM | POA: Diagnosis not present

## 2024-03-12 DIAGNOSIS — Z6841 Body Mass Index (BMI) 40.0 and over, adult: Secondary | ICD-10-CM | POA: Diagnosis not present

## 2024-05-21 ENCOUNTER — Other Ambulatory Visit: Payer: Self-pay | Admitting: *Deleted

## 2024-05-21 DIAGNOSIS — E785 Hyperlipidemia, unspecified: Secondary | ICD-10-CM

## 2024-07-25 ENCOUNTER — Institutional Professional Consult (permissible substitution): Payer: Self-pay | Admitting: Professional Counselor

## 2024-07-26 ENCOUNTER — Ambulatory Visit: Payer: Self-pay | Admitting: Nurse Practitioner

## 2024-07-26 ENCOUNTER — Encounter: Payer: Self-pay | Admitting: Nurse Practitioner

## 2024-07-26 DIAGNOSIS — F419 Anxiety disorder, unspecified: Secondary | ICD-10-CM

## 2024-07-26 DIAGNOSIS — I10 Essential (primary) hypertension: Secondary | ICD-10-CM

## 2024-07-26 DIAGNOSIS — E785 Hyperlipidemia, unspecified: Secondary | ICD-10-CM

## 2024-08-15 ENCOUNTER — Telehealth: Payer: Self-pay | Admitting: Family Medicine

## 2024-08-15 ENCOUNTER — Other Ambulatory Visit: Payer: Self-pay | Admitting: Nurse Practitioner

## 2024-08-15 DIAGNOSIS — I10 Essential (primary) hypertension: Secondary | ICD-10-CM

## 2024-08-15 DIAGNOSIS — F419 Anxiety disorder, unspecified: Secondary | ICD-10-CM

## 2024-08-15 DIAGNOSIS — E785 Hyperlipidemia, unspecified: Secondary | ICD-10-CM

## 2024-08-16 ENCOUNTER — Encounter: Payer: Self-pay | Admitting: Nurse Practitioner

## 2024-08-16 NOTE — Telephone Encounter (Signed)
 Nena pt NTBS by new provider for 6 mos FU 30-d given today

## 2024-08-16 NOTE — Telephone Encounter (Signed)
 LMTCB to schedule appt Letter mailed

## 2024-08-16 NOTE — Telephone Encounter (Signed)
 Closing encounter, addressed in electronic request NTBS
